# Patient Record
Sex: Male | Born: 1990 | Race: Black or African American | Hispanic: No | Marital: Single | Smoking: Never smoker
Health system: Southern US, Community
[De-identification: ages and names within clinical notes are randomized; demographics above are authoritative.]

## PROBLEM LIST (undated history)

## (undated) DIAGNOSIS — J45909 Unspecified asthma, uncomplicated: Secondary | ICD-10-CM

## (undated) DIAGNOSIS — D649 Anemia, unspecified: Secondary | ICD-10-CM

## (undated) HISTORY — DX: Anemia, unspecified: D64.9

## (undated) HISTORY — PX: COLONOSCOPY: SHX174

## (undated) HISTORY — DX: Unspecified asthma, uncomplicated: J45.909

## (undated) HISTORY — PX: ESOPHAGOGASTRODUODENOSCOPY: SHX1529

## (undated) HISTORY — PX: NO PAST SURGERIES: SHX2092

---

## 2011-05-22 ENCOUNTER — Emergency Department (HOSPITAL_COMMUNITY)
Admission: EM | Admit: 2011-05-22 | Discharge: 2011-05-22 | Disposition: A | Discharge status: Home / Self Care | Payer: PRIVATE HEALTH INSURANCE | Attending: Emergency Medicine | Admitting: Emergency Medicine

## 2011-05-22 DIAGNOSIS — R6889 Other general symptoms and signs: Secondary | ICD-10-CM | POA: Insufficient documentation

## 2011-05-22 DIAGNOSIS — Z79899 Other long term (current) drug therapy: Secondary | ICD-10-CM | POA: Insufficient documentation

## 2011-05-22 DIAGNOSIS — J45909 Unspecified asthma, uncomplicated: Secondary | ICD-10-CM | POA: Insufficient documentation

## 2011-05-22 DIAGNOSIS — R52 Pain, unspecified: Secondary | ICD-10-CM | POA: Insufficient documentation

## 2011-05-22 DIAGNOSIS — R070 Pain in throat: Secondary | ICD-10-CM | POA: Insufficient documentation

## 2011-05-22 DIAGNOSIS — J069 Acute upper respiratory infection, unspecified: Secondary | ICD-10-CM | POA: Insufficient documentation

## 2020-01-28 ENCOUNTER — Encounter: Payer: Self-pay | Admitting: Family Medicine

## 2020-02-03 ENCOUNTER — Encounter: Payer: Self-pay | Admitting: Gastroenterology

## 2020-03-21 ENCOUNTER — Ambulatory Visit (INDEPENDENT_AMBULATORY_CARE_PROVIDER_SITE_OTHER): Payer: Self-pay | Admitting: Gastroenterology

## 2020-03-21 ENCOUNTER — Encounter (INDEPENDENT_AMBULATORY_CARE_PROVIDER_SITE_OTHER): Payer: Self-pay

## 2020-03-21 VITALS — BP 120/76 | HR 64 | Ht 70.5 in | Wt 213.4 lb

## 2020-03-21 DIAGNOSIS — K625 Hemorrhage of anus and rectum: Secondary | ICD-10-CM

## 2020-03-21 DIAGNOSIS — Z01818 Encounter for other preprocedural examination: Secondary | ICD-10-CM

## 2020-03-21 NOTE — Progress Notes (Signed)
HPI: This is a very pleasant 29 year old man who was referred to me by Verlon Au, MD  to evaluate rectal bleeding.    He has had bright red blood rectally for at least a few years but it has increased in the past several months to year.  He never really has constipation or straining to move his bowels but he has started lifting weights about 1 year ago.  He has lost about 20 pounds in that interim.  He describes a bleed he has sometimes dripping into the toilet.  He never has anal pain.  Usually the blood is streaking the toilet paper.  Colon cancer does not run in his family  Old Data Reviewed: CBC April 2021 showed a normal hemoglobin but his platelets were slightly low at 125.  None   Review of systems: Pertinent positive and negative review of systems were noted in the above HPI section. All other review negative.   Past Medical History:  Diagnosis Date  . Asthma     Past Surgical History:  Procedure Laterality Date  . NO PAST SURGERIES      No current outpatient medications on file.   No current facility-administered medications for this visit.    Allergies as of 03/21/2020  . (No Known Allergies)    Family History  Problem Relation Age of Onset  . Hypertension Mother   . Migraines Mother   . Asthma Brother   . Asthma Paternal Grandmother     Social History   Socioeconomic History  . Marital status: Single    Spouse name: Not on file  . Number of children: 0  . Years of education: Not on file  . Highest education level: Not on file  Occupational History  . Not on file  Tobacco Use  . Smoking status: Never Smoker  . Smokeless tobacco: Never Used  Vaping Use  . Vaping Use: Never used  Substance and Sexual Activity  . Alcohol use: Not Currently  . Drug use: Never  . Sexual activity: Not on file  Other Topics Concern  . Not on file  Social History Narrative  . Not on file   Social Determinants of Health   Financial Resource Strain:    . Difficulty of Paying Living Expenses:   Food Insecurity:   . Worried About Programme researcher, broadcasting/film/video in the Last Year:   . Barista in the Last Year:   Transportation Needs:   . Freight forwarder (Medical):   Marland Kitchen Lack of Transportation (Non-Medical):   Physical Activity:   . Days of Exercise per Week:   . Minutes of Exercise per Session:   Stress:   . Feeling of Stress :   Social Connections:   . Frequency of Communication with Friends and Family:   . Frequency of Social Gatherings with Friends and Family:   . Attends Religious Services:   . Active Member of Clubs or Organizations:   . Attends Banker Meetings:   Marland Kitchen Marital Status:   Intimate Partner Violence:   . Fear of Current or Ex-Partner:   . Emotionally Abused:   Marland Kitchen Physically Abused:   . Sexually Abused:      Physical Exam: Ht 5' 10.5" (1.791 m) Comment: height measured without shoes  Wt 213 lb 6 oz (96.8 kg)   BMI 30.18 kg/m  Constitutional: generally well-appearing Psychiatric: alert and oriented x3 Eyes: extraocular movements intact Mouth: oral pharynx moist, no lesions Neck: supple no lymphadenopathy  Cardiovascular: heart regular rate and rhythm Lungs: clear to auscultation bilaterally Abdomen: soft, nontender, nondistended, no obvious ascites, no peritoneal signs, normal bowel sounds Extremities: no lower extremity edema bilaterally Skin: no lesions on visible extremities Rectal examination: Small amount of deflated external hemorrhoid tissue.  No anal fissures, digital rectal exam was otherwise normal.  Brown stool that was not checked for Hemoccult  Assessment and plan: 29 y.o. male with minor rectal bleeding  I suspect his minor rectal bleeding is related to hemorrhoids.  I did not mention above but he did try some prescription hemorrhoid cream for about 1 week if not make a difference.  He clearly has external hemorrhoids.  He may also have internal hemorrhoids.  I recommended a  flexible sigmoidoscopy at his soonest convenience to characterize the hemorrhoids and also to exclude other potential causes of his rectal bleeding such as neoplasm which I think is unlikely.  The possibly if this is indeed just hemorrhoid disease that his recent weight lifting may be playing a role.     Please see the "Patient Instructions" section for addition details about the plan.   Rob Bunting, MD  Gastroenterology 03/21/2020, 3:09 PM  Cc: Verlon Au, MD  Total time on date of encounter was 45  minutes (this included time spent preparing to see the patient reviewing records; obtaining and/or reviewing separately obtained history; performing a medically appropriate exam and/or evaluation; counseling and educating the patient and family if present; ordering medications, tests or procedures if applicable; and documenting clinical information in the health record).

## 2020-03-21 NOTE — Patient Instructions (Signed)
If you are age 29 or older, your body mass index should be between 23-30. Your Body mass index is 30.18 kg/m. If this is out of the aforementioned range listed, please consider follow up with your Primary Care Provider.  If you are age 3 or younger, your body mass index should be between 19-25. Your Body mass index is 30.18 kg/m. If this is out of the aformentioned range listed, please consider follow up with your Primary Care Provider.   You have been scheduled for a flexible sigmoidoscopy. Please follow the written instructions given to you at your visit today. If you use inhalers (even only as needed), please bring them with you on the day of your procedure.  Due to recent changes in healthcare laws, you may see the results of your imaging and laboratory studies on MyChart before your provider has had a chance to review them.  We understand that in some cases there may be results that are confusing or concerning to you. Not all laboratory results come back in the same time frame and the provider may be waiting for multiple results in order to interpret others.  Please give [place] 48 hours in order for your provider to thoroughly review all the results before contacting the office for clarification of your results.   Thank you for entrusting me with your care and choosing [facility].  Dr Christella Hartigan

## 2020-05-25 ENCOUNTER — Other Ambulatory Visit: Payer: Self-pay | Admitting: Gastroenterology

## 2021-01-03 ENCOUNTER — Telehealth: Payer: Self-pay | Admitting: Hematology and Oncology

## 2021-01-03 NOTE — Telephone Encounter (Signed)
Received a new hem referral from Virl Son from [facility] for pancytopenia. Ms. Vowels has been cld and scheduled to see Dr. Bertis Ruddy on 5/13 at 1pm. Pt aware to arrive 30 minutes early. A good faith estimate letter has been mailed to the pt.

## 2021-01-04 ENCOUNTER — Encounter: Payer: Self-pay | Admitting: Hematology and Oncology

## 2021-01-10 ENCOUNTER — Encounter: Payer: Self-pay | Admitting: Hematology and Oncology

## 2021-01-10 DIAGNOSIS — N183 Chronic kidney disease, stage 3 unspecified: Secondary | ICD-10-CM | POA: Insufficient documentation

## 2021-01-10 DIAGNOSIS — N182 Chronic kidney disease, stage 2 (mild): Secondary | ICD-10-CM | POA: Insufficient documentation

## 2021-01-10 DIAGNOSIS — D61818 Other pancytopenia: Secondary | ICD-10-CM | POA: Insufficient documentation

## 2021-01-11 ENCOUNTER — Ambulatory Visit: Payer: Self-pay

## 2021-01-11 ENCOUNTER — Other Ambulatory Visit: Payer: Self-pay

## 2021-01-11 ENCOUNTER — Other Ambulatory Visit: Payer: Self-pay | Admitting: Family Medicine

## 2021-01-11 DIAGNOSIS — Z0000 Encounter for general adult medical examination without abnormal findings: Secondary | ICD-10-CM

## 2021-01-12 ENCOUNTER — Inpatient Hospital Stay: Payer: Self-pay | Attending: Hematology and Oncology

## 2021-01-12 ENCOUNTER — Encounter: Payer: Self-pay | Admitting: Hematology and Oncology

## 2021-01-12 ENCOUNTER — Inpatient Hospital Stay (HOSPITAL_BASED_OUTPATIENT_CLINIC_OR_DEPARTMENT_OTHER): Payer: Self-pay | Admitting: Hematology and Oncology

## 2021-01-12 VITALS — BP 136/72 | HR 59 | Temp 97.9°F | Resp 18 | Ht 70.5 in | Wt 218.0 lb

## 2021-01-12 DIAGNOSIS — E538 Deficiency of other specified B group vitamins: Secondary | ICD-10-CM | POA: Insufficient documentation

## 2021-01-12 DIAGNOSIS — D61818 Other pancytopenia: Secondary | ICD-10-CM | POA: Insufficient documentation

## 2021-01-12 DIAGNOSIS — R5383 Other fatigue: Secondary | ICD-10-CM

## 2021-01-12 DIAGNOSIS — N182 Chronic kidney disease, stage 2 (mild): Secondary | ICD-10-CM

## 2021-01-12 DIAGNOSIS — K625 Hemorrhage of anus and rectum: Secondary | ICD-10-CM | POA: Insufficient documentation

## 2021-01-12 DIAGNOSIS — N189 Chronic kidney disease, unspecified: Secondary | ICD-10-CM | POA: Insufficient documentation

## 2021-01-12 DIAGNOSIS — D631 Anemia in chronic kidney disease: Secondary | ICD-10-CM

## 2021-01-12 LAB — CBC WITH DIFFERENTIAL/PLATELET
Abs Immature Granulocytes: 0.01 10*3/uL (ref 0.00–0.07)
Basophils Absolute: 0 10*3/uL (ref 0.0–0.1)
Basophils Relative: 1 %
Eosinophils Absolute: 0.2 10*3/uL (ref 0.0–0.5)
Eosinophils Relative: 6 %
HCT: 40 % (ref 39.0–52.0)
Hemoglobin: 13.5 g/dL (ref 13.0–17.0)
Immature Granulocytes: 0 %
Lymphocytes Relative: 34 %
Lymphs Abs: 1.2 10*3/uL (ref 0.7–4.0)
MCH: 29.5 pg (ref 26.0–34.0)
MCHC: 33.8 g/dL (ref 30.0–36.0)
MCV: 87.5 fL (ref 80.0–100.0)
Monocytes Absolute: 0.4 10*3/uL (ref 0.1–1.0)
Monocytes Relative: 11 %
Neutro Abs: 1.6 10*3/uL — ABNORMAL LOW (ref 1.7–7.7)
Neutrophils Relative %: 48 %
Platelets: 146 10*3/uL — ABNORMAL LOW (ref 150–400)
RBC: 4.57 MIL/uL (ref 4.22–5.81)
RDW: 12.5 % (ref 11.5–15.5)
WBC: 3.4 10*3/uL — ABNORMAL LOW (ref 4.0–10.5)
nRBC: 0 % (ref 0.0–0.2)

## 2021-01-12 LAB — VITAMIN B12: Vitamin B-12: 186 pg/mL (ref 180–914)

## 2021-01-12 LAB — SEDIMENTATION RATE: Sed Rate: 10 mm/hr (ref 0–16)

## 2021-01-12 LAB — IRON AND TIBC
Iron: 72 ug/dL (ref 42–163)
Saturation Ratios: 21 % (ref 20–55)
TIBC: 340 ug/dL (ref 202–409)
UIBC: 268 ug/dL (ref 117–376)

## 2021-01-12 LAB — FERRITIN: Ferritin: 28 ng/mL (ref 24–336)

## 2021-01-12 LAB — TSH: TSH: 1.983 u[IU]/mL (ref 0.320–4.118)

## 2021-01-12 NOTE — Progress Notes (Signed)
[facility] CONSULT NOTE  Patient Care Team: Verlon Au, MD as PCP - General (Family Medicine)  CHIEF COMPLAINTS/PURPOSE OF CONSULTATION:  Chronic pancytopenia  HISTORY OF PRESENTING ILLNESS:  Charles Gomez 30 y.o. male is here because of chronic pancytopenia  He was found to have abnormal CBC from blood count done by his primary care doctor I have the opportunity to review his CBC dated back to 2016 He had normal white blood cell count and platelet count in 2018 and 2016 but has been anemic since 2016 with hemoglobin around 13.6 Starting last year from 03/23/20, he was noted to have intermittent leukopenia with white count as low as 3.0, chronic anemia with hemoglobin around 13 and platelet count of 111 He denies recent chest pain on exertion, shortness of breath on minimal exertion, pre-syncopal episodes, or palpitations. He has been having chronic rectal bleeding with passage of stool for over 5 years He denies spontaneous nosebleed or hematuria He has seen gastroenterologist last year and was recommended sigmoidoscopy but declined due to lack of insurance  The patient denies over the counter NSAID ingestion. He is not on antiplatelets agents. He has been on a vegan diet for the last 3 years He used to take a lot of nutritional supplements including zinc but stopped last year  He had no prior history or diagnosis of cancer. His age appropriate screening programs are up-to-date. He denies any pica  He never donated blood or received blood transfusion He denies recurrent infection He was evaluated with renal ultrasound by nephrologist with no cause found for his chronic renal failure  MEDICAL HISTORY:  Past Medical History:  Diagnosis Date  . Anemia   . Asthma     SURGICAL HISTORY: Past Surgical History:  Procedure Laterality Date  . NO PAST SURGERIES      SOCIAL HISTORY: Social History   Socioeconomic History  . Marital status: Single    Spouse  name: Not on file  . Number of children: 0  . Years of education: Not on file  . Highest education level: Not on file  Occupational History  . Occupation: work in a lab and post office  Tobacco Use  . Smoking status: Never Smoker  . Smokeless tobacco: Never Used  Vaping Use  . Vaping Use: Never used  Substance and Sexual Activity  . Alcohol use: Not Currently  . Drug use: Never  . Sexual activity: Not on file  Other Topics Concern  . Not on file  Social History Narrative  . Not on file   Social Determinants of Health   Financial Resource Strain: Not on file  Food Insecurity: Not on file  Transportation Needs: Not on file  Physical Activity: Not on file  Stress: Not on file  Social Connections: Not on file  Intimate Partner Violence: Not on file    FAMILY HISTORY: Family History  Problem Relation Age of Onset  . Hypertension Mother   . Migraines Mother   . Asthma Brother   . Asthma Paternal Grandmother     ALLERGIES:  has No Known Allergies.  MEDICATIONS:  Current Outpatient Medications  Medication Sig Dispense Refill  . cholecalciferol (VITAMIN D3) 25 MCG (1000 UNIT) tablet Take 2,000 Units by mouth daily.     No current facility-administered medications for this visit.    REVIEW OF SYSTEMS:   Constitutional: Denies fevers, chills or abnormal night sweats Eyes: Denies blurriness of vision, double vision or watery eyes Ears, nose, mouth, throat, and  face: Denies mucositis or sore throat Respiratory: Denies cough, dyspnea or wheezes Cardiovascular: Denies palpitation, chest discomfort or lower extremity swelling Gastrointestinal:  Denies nausea, heartburn or change in bowel habits Skin: Denies abnormal skin rashes Lymphatics: Denies new lymphadenopathy or easy bruising Neurological:Denies numbness, tingling or new weaknesses Behavioral/Psych: Mood is stable, no new changes  All other systems were reviewed with the patient and are negative.  PHYSICAL  EXAMINATION: ECOG PERFORMANCE STATUS: 1 - Symptomatic but completely ambulatory  Vitals:   01/12/21 1317  BP: 136/72  Pulse: (!) 59  Resp: 18  Temp: 97.9 F (36.6 C)  SpO2: 100%   Filed Weights   01/12/21 1317  Weight: 218 lb (98.9 kg)    GENERAL:alert, no distress and comfortable SKIN: skin color, texture, turgor are normal, no rashes or significant lesions EYES: normal, conjunctiva are pink and non-injected, sclera clear OROPHARYNX:no exudate, no erythema and lips, buccal mucosa, and tongue normal  NECK: supple, thyroid normal size, non-tender, without nodularity LYMPH:  no palpable lymphadenopathy in the cervical, axillary or inguinal LUNGS: clear to auscultation and percussion with normal breathing effort HEART: regular rate & rhythm and no murmurs and no lower extremity edema ABDOMEN:abdomen soft, non-tender and normal bowel sounds Musculoskeletal:no cyanosis of digits and no clubbing  PSYCH: alert & oriented x 3 with fluent speech NEURO: no focal motor/sensory deficits  RADIOGRAPHIC STUDIES: I have personally reviewed the radiological images as listed and agreed with the findings in the report. DG Chest 1 View  Result Date: 01/11/2021 CLINICAL DATA:  Physical exam. EXAM: CHEST  1 VIEW COMPARISON:  None. FINDINGS: The heart size and mediastinal contours are within normal limits. Both lungs are clear. The visualized skeletal structures are unremarkable. IMPRESSION: No active disease. Electronically Signed   By: Obie Dredge M.D.   On: 01/11/2021 14:38    ASSESSMENT & PLAN:  Anemia in chronic kidney disease The cause of the anemia is multifactorial, likely due to anemia of chronic kidney disease compounded by intermittent rectal bleeding He is also on a vegan diet and could have some mineral deficiencies I will order additional work-up  CKD (chronic kidney disease), stage II He is noted to have elevated serum creatinine since 2016, cause unknown We discussed the  importance of adequate hydration  Rectal bleeding He has chronic rectal bleeding, likely due to hemorrhoids based on prior gastroenterologist evaluation He has not pursue sigmoidoscopy as recommended by GI physician due to lack of insurance   Other pancytopenia (HCC) He has intermittent leukopenia, causes unknown I am wondering whether he could have vitamin B12 deficiency due to his vegan diet He is not symptomatic From the thrombocytopenia standpoint, his MPV is high I am wondering whether he could have very mild ITP I will order additional work-up and we will see him back next week for further follow-up  Orders Placed This Encounter  Procedures  . CBC with Differential/Platelet    Standing Status:   Future    Number of Occurrences:   1    Standing Expiration Date:   01/12/2022  . Ferritin    Standing Status:   Future    Number of Occurrences:   1    Standing Expiration Date:   01/12/2022  . Iron and TIBC    Standing Status:   Future    Number of Occurrences:   1    Standing Expiration Date:   01/12/2022  . Vitamin B12    Standing Status:   Future    Number of  Occurrences:   1    Standing Expiration Date:   01/12/2022  . Sedimentation rate    Standing Status:   Future    Number of Occurrences:   1    Standing Expiration Date:   01/12/2022  . ANA, IFA (with reflex)    Standing Status:   Future    Number of Occurrences:   1    Standing Expiration Date:   01/12/2022  . TSH    Standing Status:   Future    Number of Occurrences:   1    Standing Expiration Date:   01/12/2022    All questions were answered. The patient knows to call the clinic with any problems, questions or concerns.  The total time spent in the appointment was 45 minutes encounter with patients including review of chart and various tests results, discussions about plan of care and coordination of care plan  Artis Delay, MD 5/13/20221:50 PM       Artis Delay, MD 01/12/21 1:50 PM

## 2021-01-12 NOTE — Assessment & Plan Note (Signed)
He has intermittent leukopenia, causes unknown I am wondering whether he could have vitamin B12 deficiency due to his vegan diet He is not symptomatic From the thrombocytopenia standpoint, his MPV is high I am wondering whether he could have very mild ITP I will order additional work-up and we will see him back next week for further follow-up

## 2021-01-12 NOTE — Assessment & Plan Note (Signed)
He is noted to have elevated serum creatinine since 2016, cause unknown We discussed the importance of adequate hydration

## 2021-01-12 NOTE — Assessment & Plan Note (Signed)
The cause of the anemia is multifactorial, likely due to anemia of chronic kidney disease compounded by intermittent rectal bleeding He is also on a vegan diet and could have some mineral deficiencies I will order additional work-up

## 2021-01-12 NOTE — Assessment & Plan Note (Signed)
He has chronic rectal bleeding, likely due to hemorrhoids based on prior gastroenterologist evaluation He has not pursue sigmoidoscopy as recommended by GI physician due to lack of insurance

## 2021-01-14 LAB — ANTINUCLEAR ANTIBODIES, IFA: ANA Ab, IFA: NEGATIVE

## 2021-01-16 ENCOUNTER — Encounter: Payer: Self-pay | Admitting: Hematology and Oncology

## 2021-01-16 ENCOUNTER — Telehealth: Payer: Self-pay | Admitting: Hematology and Oncology

## 2021-01-16 ENCOUNTER — Other Ambulatory Visit: Payer: Self-pay

## 2021-01-16 ENCOUNTER — Inpatient Hospital Stay: Payer: Self-pay | Admitting: Hematology and Oncology

## 2021-01-16 VITALS — BP 134/94 | HR 61 | Temp 97.4°F | Resp 18 | Ht 70.5 in | Wt 217.2 lb

## 2021-01-16 DIAGNOSIS — E611 Iron deficiency: Secondary | ICD-10-CM

## 2021-01-16 DIAGNOSIS — E538 Deficiency of other specified B group vitamins: Secondary | ICD-10-CM

## 2021-01-16 DIAGNOSIS — D61818 Other pancytopenia: Secondary | ICD-10-CM

## 2021-01-16 NOTE — Assessment & Plan Note (Signed)
I have reviewed multiple test results with the patient Overall, his pancytopenia is most consistent with borderline vitamin B12 deficiency, likely due to his vegan diet I recommend high-dose oral vitamin B12 supplement and I plan to check it again in 6 months

## 2021-01-16 NOTE — Assessment & Plan Note (Signed)
His iron deficiency is likely due to his dietary choices and chronic rectal bleeding I recommend oral iron supplement

## 2021-01-16 NOTE — Telephone Encounter (Signed)
Scheduled appointment per 05/17 schedule message. Patient is aware. 

## 2021-01-16 NOTE — Progress Notes (Signed)
[facility] OFFICE PROGRESS NOTE  Charles Au, MD  ASSESSMENT & PLAN:  Other pancytopenia [facility]) I have reviewed multiple test results with the patient Overall, his pancytopenia is most consistent with borderline vitamin B12 deficiency, likely due to his vegan diet I recommend high-dose oral vitamin B12 supplement and I plan to check it again in 6 months  Iron deficiency His iron deficiency is likely due to his dietary choices and chronic rectal bleeding I recommend oral iron supplement   Orders Placed This Encounter  Procedures  . Iron and TIBC    Standing Status:   Future    Standing Expiration Date:   01/16/2022  . Ferritin    Standing Status:   Future    Standing Expiration Date:   01/16/2022  . Vitamin B12    Standing Status:   Future    Standing Expiration Date:   01/16/2022  . CBC with Differential/Platelet    Standing Status:   Future    Standing Expiration Date:   01/16/2022    The total time spent in the appointment was 20 minutes encounter with patients including review of chart and various tests results, discussions about plan of care and coordination of care plan   All questions were answered. The patient knows to call the clinic with any problems, questions or concerns. No barriers to learning was detected.    Artis Delay, MD 5/17/20229:58 AM  INTERVAL HISTORY: Charles Gomez 30 y.o. male returns for further follow-up He was seen last week for signs of chronic pancytopenia He was not symptomatic The patient consumes a vegan diet for the last few years He has chronic renal failure of unknown reason and chronic rectal bleeding due to hemorrhoids  SUMMARY OF HEMATOLOGIC HISTORY: Charles Gomez 30 y.o. male is here because of chronic pancytopenia  He was found to have abnormal CBC from blood count done by his primary care doctor I have the opportunity to review his CBC dated back to 2016 He had normal white blood cell count and platelet count  in 2018 and 2016 but has been anemic since 2016 with hemoglobin around 13.6 Starting last year from 03/23/20, he was noted to have intermittent leukopenia with white count as low as 3.0, chronic anemia with hemoglobin around 13 and platelet count of 111 He denies recent chest pain on exertion, shortness of breath on minimal exertion, pre-syncopal episodes, or palpitations. He has been having chronic rectal bleeding with passage of stool for over 5 years He denies spontaneous nosebleed or hematuria He has seen gastroenterologist last year and was recommended sigmoidoscopy but declined due to lack of insurance  The patient denies over the counter NSAID ingestion. He is not on antiplatelets agents. He has been on a vegan diet for the last 3 years He used to take a lot of nutritional supplements including zinc but stopped last year  He had no prior history or diagnosis of cancer. His age appropriate screening programs are up-to-date. He denies any pica  He never donated blood or received blood transfusion He denies recurrent infection He was evaluated with renal ultrasound by nephrologist with no cause found for his chronic renal failure Blood work from May 2020 to confirm borderline iron and B12 deficiency  I have reviewed the past medical history, past surgical history, social history and family history with the patient and they are unchanged from previous note.  ALLERGIES:  has No Known Allergies.  MEDICATIONS:  Current Outpatient Medications  Medication Sig Dispense  Refill  . ferrous sulfate 324 MG TBEC Take 324 mg by mouth.    . vitamin B-12 (CYANOCOBALAMIN) 1000 MCG tablet Take 1,000 mcg by mouth daily.    . cholecalciferol (VITAMIN D3) 25 MCG (1000 UNIT) tablet Take 2,000 Units by mouth daily.     No current facility-administered medications for this visit.     REVIEW OF SYSTEMS:   Constitutional: Denies fevers, chills or night sweats Eyes: Denies blurriness of vision Ears, nose,  mouth, throat, and face: Denies mucositis or sore throat Respiratory: Denies cough, dyspnea or wheezes Cardiovascular: Denies palpitation, chest discomfort or lower extremity swelling Gastrointestinal:  Denies nausea, heartburn or change in bowel habits Skin: Denies abnormal skin rashes Lymphatics: Denies new lymphadenopathy or easy bruising Neurological:Denies numbness, tingling or new weaknesses Behavioral/Psych: Mood is stable, no new changes  All other systems were reviewed with the patient and are negative.  PHYSICAL EXAMINATION: ECOG PERFORMANCE STATUS: 0 - Asymptomatic  Vitals:   01/16/21 0841  BP: (!) 134/94  Pulse: 61  Resp: 18  Temp: (!) 97.4 F (36.3 C)  SpO2: 100%   Filed Weights   01/16/21 0841  Weight: 217 lb 3.2 oz (98.5 kg)    GENERAL:alert, no distress and comfortable NEURO: alert & oriented x 3 with fluent speech, no focal motor/sensory deficits  LABORATORY DATA:  I have reviewed the data as listed  No results found for: NA, K, CL, CO2, GLUCOSE, BUN, CREATININE, CALCIUM, PROT, ALBUMIN, AST, ALT, ALKPHOS, BILITOT, GFRNONAA, GFRAA  No results found for: SPEP, UPEP  Lab Results  Component Value Date   WBC 3.4 (L) 01/12/2021   NEUTROABS 1.6 (L) 01/12/2021   HGB 13.5 01/12/2021   HCT 40.0 01/12/2021   MCV 87.5 01/12/2021   PLT 146 (L) 01/12/2021      Chemistry   No results found for: NA, K, CL, CO2, BUN, CREATININE, GLU No results found for: CALCIUM, ALKPHOS, AST, ALT, BILITOT

## 2021-07-13 ENCOUNTER — Other Ambulatory Visit: Payer: Self-pay

## 2021-07-16 ENCOUNTER — Ambulatory Visit: Payer: Self-pay | Admitting: Hematology and Oncology

## 2021-07-17 ENCOUNTER — Other Ambulatory Visit: Payer: Self-pay

## 2021-07-19 ENCOUNTER — Ambulatory Visit: Payer: Self-pay | Admitting: Hematology and Oncology

## 2021-07-19 ENCOUNTER — Other Ambulatory Visit: Payer: Self-pay

## 2022-06-13 ENCOUNTER — Encounter (HOSPITAL_COMMUNITY): Payer: Self-pay

## 2022-06-13 ENCOUNTER — Ambulatory Visit (HOSPITAL_COMMUNITY): Payer: Managed Care, Other (non HMO)

## 2022-06-13 ENCOUNTER — Ambulatory Visit (HOSPITAL_COMMUNITY)
Admission: RE | Admit: 2022-06-13 | Discharge: 2022-06-13 | Disposition: A | Discharge status: Home / Self Care | Payer: Managed Care, Other (non HMO) | Source: Ambulatory Visit | Attending: Physician Assistant | Admitting: Physician Assistant

## 2022-06-13 VITALS — BP 135/78 | Temp 98.4°F | Resp 17

## 2022-06-13 DIAGNOSIS — M62838 Other muscle spasm: Secondary | ICD-10-CM

## 2022-06-13 DIAGNOSIS — S161XXA Strain of muscle, fascia and tendon at neck level, initial encounter: Secondary | ICD-10-CM | POA: Diagnosis not present

## 2022-06-13 DIAGNOSIS — M542 Cervicalgia: Secondary | ICD-10-CM

## 2022-06-13 MED ORDER — METHOCARBAMOL 500 MG PO TABS: 500.0000 mg | ORAL_TABLET | Freq: Two times a day (BID) | ORAL | 0 refills | Status: DC
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
## 2022-06-13 NOTE — Discharge Instructions (Signed)
Your x-ray was normal with no evidence of fracture.  I believe that you strained the muscles in your neck.  Use heat, rest, stretch for symptom relief.  Take Robaxin up to twice a day.  This will make you sleepy do not drive or drink alcohol with taking it.  You can alternate Tylenol ibuprofen over-the-counter.  I do recommend you follow-up with sports medicine if your symptoms or not improving quickly.  They can help establish you with physical therapy and other interventions that may help improve your symptoms.  If anything worsens you develop significant pain, weakness, numbness or paresthesias in your hands you should be seen immediately.
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
## 2022-07-03 ENCOUNTER — Ambulatory Visit: Payer: Self-pay

## 2022-07-04 IMAGING — DX DG CHEST 1V
1 series · 1 of 1 positions shown · non-contrast
Comparison: None.

CLINICAL DATA: Physical exam.

EXAM:
CHEST  1 VIEW

[chest pa]
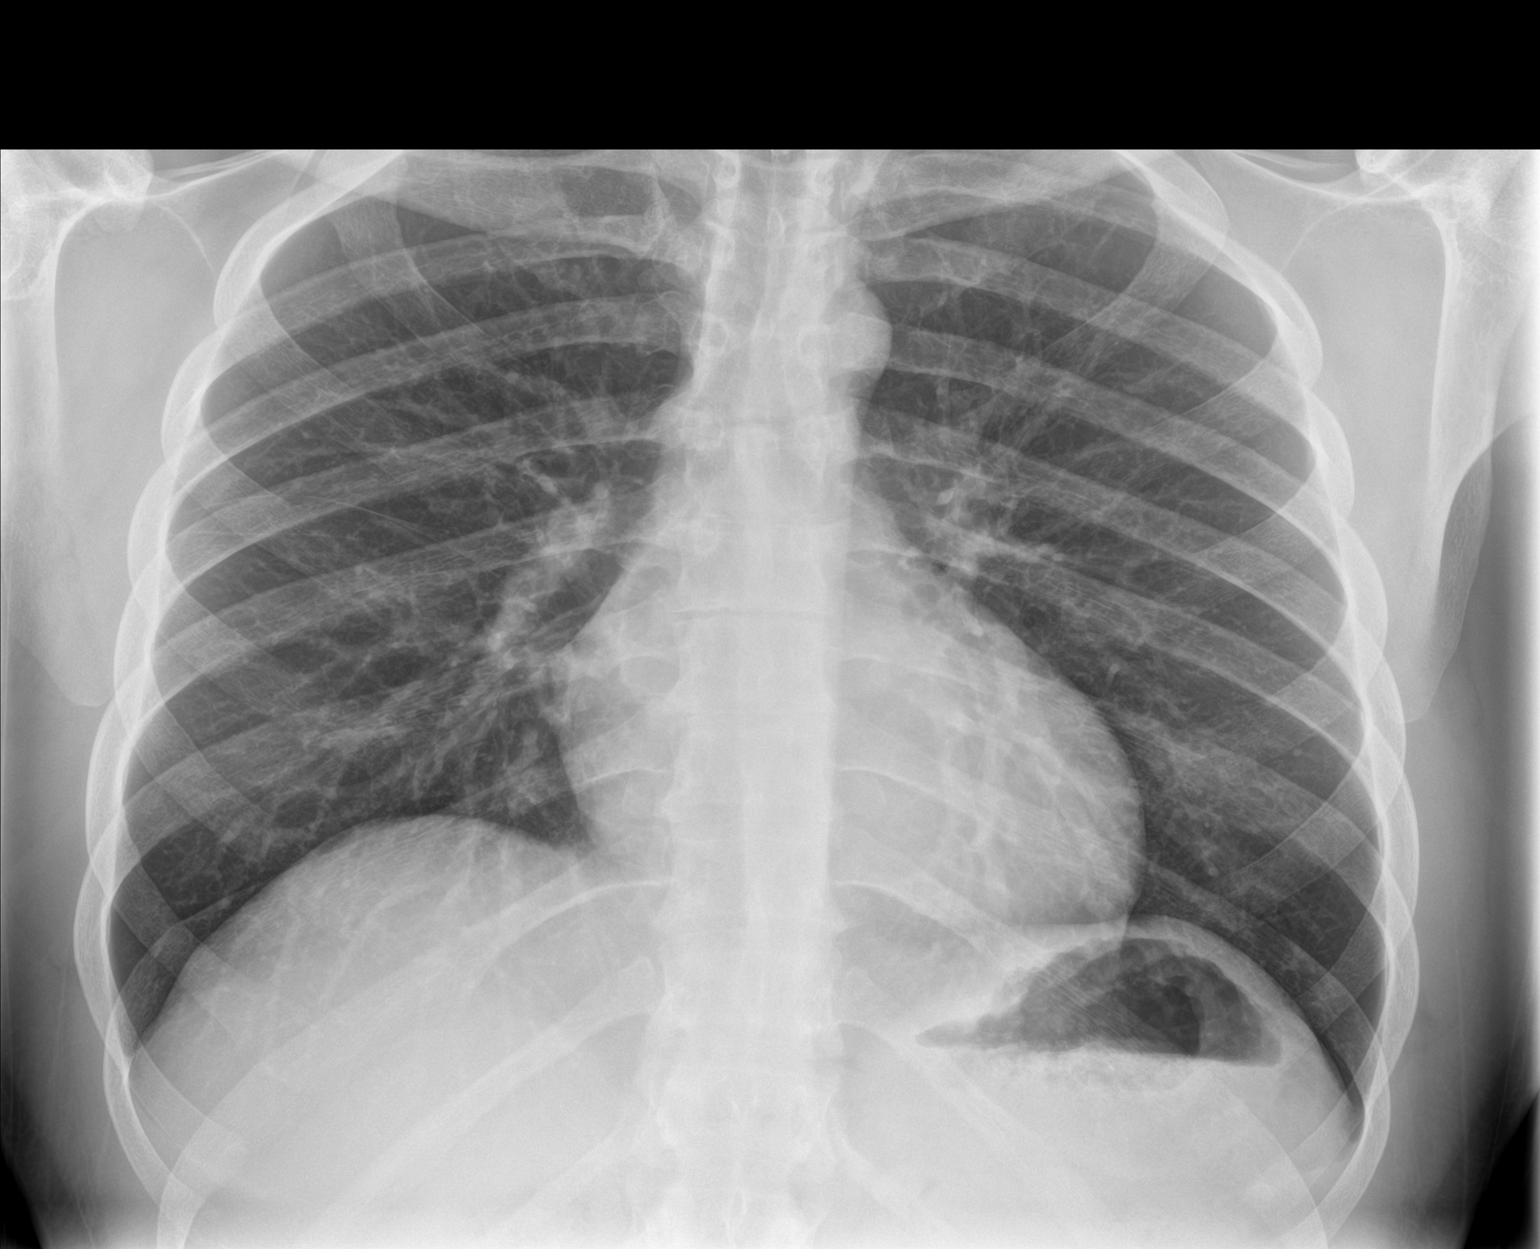

[1 of 1 positions shown; findings below may reference images not displayed]

FINDINGS: The heart size and mediastinal contours are within normal limits.
Both lungs are clear. The visualized skeletal structures are
unremarkable.
IMPRESSION: No active disease.

## 2023-03-21 ENCOUNTER — Encounter (HOSPITAL_COMMUNITY): Payer: Self-pay

## 2023-03-21 ENCOUNTER — Ambulatory Visit (HOSPITAL_COMMUNITY)
Admission: RE | Admit: 2023-03-21 | Discharge: 2023-03-21 | Disposition: A | Discharge status: Home / Self Care | Payer: Managed Care, Other (non HMO) | Source: Ambulatory Visit | Attending: Nurse Practitioner

## 2023-03-21 VITALS — BP 124/80 | HR 82 | Temp 98.0°F | Resp 16

## 2023-03-21 DIAGNOSIS — J069 Acute upper respiratory infection, unspecified: Secondary | ICD-10-CM | POA: Diagnosis not present

## 2023-03-21 MED ORDER — BENZONATATE 100 MG PO CAPS: 100.0000 mg | ORAL_CAPSULE | Freq: Three times a day (TID) | ORAL | 0 refills | Status: DC | PRN

## 2023-03-21 NOTE — Discharge Instructions (Addendum)
You have a viral upper respiratory infection.  Symptoms should improve over the next week to 10 days.  If you develop chest pain or shortness of breath, go to the emergency room.  Some things that can make you feel better are: - Increased rest - Increasing fluid with water/sugar free electrolytes - Acetaminophen and ibuprofen as needed for fever/pain - Salt water gargling, chloraseptic spray and throat lozenges - OTC guaifenesin (Mucinex) 600 mg twice daily for congestion - Saline sinus flushes or a neti pot - Humidifying the air -Tessalon Perles every 8 hours as needed for dry cough 

## 2023-03-21 NOTE — ED Triage Notes (Signed)
Patient reports that he has a productive cough with green sputum x 2 days.  Patient states he has taken allergy medication and using cough drops.

## 2023-03-21 NOTE — ED Provider Notes (Signed)
[facility]    CSN: [id number] Arrival date & time: 03/21/23  1203      History   Chief Complaint Chief Complaint  Patient presents with   Cough    Entered by patient    HPI Charles Gomez is a 32 y.o. male.   Patient presents today with 2 day history of congestion cough, stuffy nose, runny nose, sore throat when he coughs, decreased appetite, and fatigue.  Patient denies fever, body aches or chills, shortness of breath or chest pain, chest tightness, chest congestion, headache, ear pain, abdominal pain, nausea/vomiting, and diarrhea.  No loss of taste or smell or known sick contacts. Has taken allergy medicine without improvement in symptoms.  Reports history of asthma.  At home COVID-19 test was negative.    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 Authorizing Provider  benzonatate (TESSALON) 100 MG capsule Take 1 capsule (100 mg total) by mouth 3 (three) times daily as needed for cough. Do not take with alcohol or while driving or operating heavy machinery.  May cause drowsiness. 03/21/23  Yes Valentino Nose, NP  cholecalciferol (VITAMIN D3) 25 MCG (1000 UNIT) tablet Take 2,000 Units by mouth daily.    [provider]  ferrous sulfate 324 MG TBEC Take 324 mg by mouth.    [provider]  methocarbamol (ROBAXIN) 500 MG tablet Take 1 tablet (500 mg total) by mouth 2 (two) times daily. 06/13/22   Raspet, Noberto Retort, PA-C  vitamin B-12  (CYANOCOBALAMIN) 1000 MCG tablet Take 1,000 mcg by mouth daily.    [provider]    Family History Family History  Problem Relation Age of Onset   Hypertension Mother    Migraines Mother    Healthy Father    Asthma Brother    Asthma Paternal Grandmother     Social History Social History   Tobacco Use   Smoking status: Never   Smokeless tobacco: Never  Vaping Use   Vaping status: Never Used  Substance Use Topics   Alcohol use: Not Currently   Drug use: Never     Allergies   Patient has no known allergies.   Review of Systems Review of Systems Per HPI  Physical Exam Triage Vital Signs ED Triage Vitals  Encounter Vitals Group     BP 03/21/23 1213 124/80     Systolic BP Percentile --      Diastolic BP Percentile --      Pulse Rate 03/21/23 1213 82     Resp 03/21/23 1213 16     Temp 03/21/23 1213 98 F (36.7 C)     Temp Source 03/21/23 1213 Oral     SpO2 03/21/23 1213 97 %     Weight --      Height --      Head Circumference --      Peak Flow --  Pain Score 03/21/23 1214 0     Pain Loc --      Pain Education --      Exclude from Growth Chart --    No data found.  Updated Vital Signs BP 124/80 (BP Location: Right Arm)   Pulse 82   Temp 98 F (36.7 C) (Oral)   Resp 16   SpO2 97%   Visual Acuity Right Eye Distance:   Left Eye Distance:   Bilateral Distance:    Right Eye Near:   Left Eye Near:    Bilateral Near:     Physical Exam Vitals and nursing note reviewed.  Constitutional:      General: He is not in acute distress.    Appearance: Normal appearance. He is not ill-appearing or toxic-appearing.  HENT:     Head: Normocephalic and atraumatic.     Right Ear: Tympanic membrane, ear canal and external ear normal.     Left Ear: Tympanic membrane, ear canal and external ear normal.     Nose: Congestion present. No rhinorrhea.     Mouth/Throat:     Mouth: Mucous membranes are moist.     Pharynx: Oropharynx is clear. Posterior  oropharyngeal erythema present. No oropharyngeal exudate.  Eyes:     General: No scleral icterus.    Extraocular Movements: Extraocular movements intact.  Cardiovascular:     Rate and Rhythm: Normal rate and regular rhythm.  Pulmonary:     Effort: Pulmonary effort is normal. No respiratory distress.     Breath sounds: Normal breath sounds. No wheezing, rhonchi or rales.  Musculoskeletal:     Cervical back: Normal range of motion and neck supple. No rigidity.  Lymphadenopathy:     Cervical: No cervical adenopathy.  Skin:    General: Skin is warm and dry.     Coloration: Skin is not jaundiced or pale.     Findings: No erythema or rash.  Neurological:     Mental Status: He is alert and oriented to person, place, and time.  Psychiatric:        Behavior: Behavior is cooperative.      UC Treatments / Results  Labs (all labs ordered are listed, but only abnormal results are displayed) Labs Reviewed - No data to display  EKG   Radiology No results found.  Procedures Procedures (including critical care time)  Medications Ordered in UC Medications - No data to display  Initial Impression / Assessment and Plan / UC Course  I have reviewed the triage vital signs and the nursing notes.  Pertinent labs & imaging results that were available during my care of the patient were reviewed by me and considered in my medical decision making (see chart for details).   Patient is well-appearing, normotensive, afebrile, not tachycardic, not tachypneic, oxygenating well on room air.    1.  Viral URI with cough  Suspect viral etiology Vitals and examination today are reassuring COVID-19 testing declined by patient Supportive care discussed Start Tessalon perles ER and return precautions discussed Work excuse given  The patient was given the opportunity to ask questions.  All questions answered to their satisfaction.  The patient is in agreement to this plan.    Final Clinical  Impressions(s) / UC Diagnoses   Final diagnoses:  Viral URI with cough     Discharge Instructions      You have a viral upper respiratory infection.  Symptoms should improve over the next week to 10 days.  If you develop  chest pain or shortness of breath, go to the emergency room.  Some things that can make you feel better are: - Increased rest - Increasing fluid with water/sugar free electrolytes - Acetaminophen and ibuprofen as needed for fever/pain - Salt water gargling, chloraseptic spray and throat lozenges - OTC guaifenesin (Mucinex) 600 mg twice daily for congestion - Saline sinus flushes or a neti pot - Humidifying the air -Tessalon Perles every 8 hours as needed for dry cough      ED Prescriptions     Medication Sig Dispense Auth. Provider   benzonatate (TESSALON) 100 MG capsule Take 1 capsule (100 mg total) by mouth 3 (three) times daily as needed for cough. Do not take with alcohol or while driving or operating heavy machinery.  May cause drowsiness. 21 capsule Valentino Nose, NP      PDMP not reviewed this encounter.   Valentino Nose, NP 03/21/23 1302

## 2023-07-01 ENCOUNTER — Ambulatory Visit (HOSPITAL_COMMUNITY)
Admission: EM | Admit: 2023-07-01 | Discharge: 2023-07-01 | Disposition: A | Discharge status: Home / Self Care | Payer: Managed Care, Other (non HMO) | Attending: Internal Medicine | Admitting: Internal Medicine

## 2023-07-01 ENCOUNTER — Encounter (HOSPITAL_COMMUNITY): Payer: Self-pay | Admitting: Emergency Medicine

## 2023-07-01 ENCOUNTER — Other Ambulatory Visit: Payer: Self-pay

## 2023-07-01 DIAGNOSIS — M25511 Pain in right shoulder: Secondary | ICD-10-CM

## 2023-07-01 DIAGNOSIS — M25512 Pain in left shoulder: Secondary | ICD-10-CM | POA: Diagnosis not present

## 2023-07-01 MED ORDER — CYCLOBENZAPRINE HCL 10 MG PO TABS: 10.0000 mg | ORAL_TABLET | Freq: Two times a day (BID) | ORAL | 0 refills | Status: DC | PRN

## 2023-07-01 MED ORDER — DEXAMETHASONE SODIUM PHOSPHATE 10 MG/ML IJ SOLN
INTRAMUSCULAR | Status: AC
  Filled 2023-07-01: qty 1

## 2023-07-01 MED ORDER — DEXAMETHASONE SODIUM PHOSPHATE 10 MG/ML IJ SOLN
10.0000 mg | Freq: Once | INTRAMUSCULAR | Status: AC
  Administered 2023-07-01: 10 mg via INTRAMUSCULAR

## 2023-07-01 MED ORDER — PREDNISONE 10 MG (21) PO TBPK: ORAL_TABLET | Freq: Every day | ORAL | 0 refills | Status: DC

## 2023-07-01 MED ORDER — KETOROLAC TROMETHAMINE 30 MG/ML IJ SOLN
INTRAMUSCULAR | Status: AC
  Filled 2023-07-01: qty 1

## 2023-07-01 MED ORDER — KETOROLAC TROMETHAMINE 30 MG/ML IJ SOLN
30.0000 mg | Freq: Once | INTRAMUSCULAR | Status: AC
  Administered 2023-07-01: 30 mg via INTRAMUSCULAR

## 2023-07-01 NOTE — Discharge Instructions (Addendum)
Likely bilateral shoulder muscle spasms. Will treat with the following: Toradol injection for pain and steroid injection today for inflammation Prednisone taper to start tomorrow Wednesday, Take 6 tabs by mouth daily for 2 days, then 5 tabs for 2 days, then 4 tabs for 2 days, then 3 tabs for 2 days, 2 tabs for 2 days, then 1 tab by mouth daily for 2 days Flexeril 10 mg every 12 hours as needed for muscle spasms. Use caution as this can make you drowsy.  If symptoms do not improve within about 4-5 days, then may need to consider follow up with orthopedist.  Return to urgent care or PCP if symptoms worsen or fail to resolve.

## 2023-07-01 NOTE — ED Provider Notes (Signed)
[facility]    CSN: [id number] Arrival date & time: 07/01/23  1218      History   Chief Complaint Chief Complaint  Patient presents with   Shoulder Pain    HPI Charles Gomez is a 32 y.o. male.   32 yr old male who presents to urgent care with complaints of bilateral shoulder pain with the right worse then the left. The right side is isolated to the shoulder but on the left there is some radiation down the arm. Denies any injury to the area recently. Denies any changes in activity that would have caused pain. Having trouble sleeping due to pain.  He reports the pain does not start at his neck but starts about mid shoulder.  It is making it difficult for him to move around as well.  He is having trouble at work.   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 Authorizing Provider  benzonatate (TESSALON) 100 MG capsule Take 1 capsule (100 mg total) by mouth 3 (three) times daily as needed for cough. Do not take with alcohol or while driving or operating heavy machinery.  May cause drowsiness. Patient not taking: Reported on 07/01/2023 03/21/23   Valentino Nose, NP  cholecalciferol (VITAMIN D3) 25 MCG (1000 UNIT) tablet Take 2,000 Units by mouth daily. Patient not taking: Reported on 07/01/2023    [provider]  ferrous sulfate 324 MG TBEC Take 324 mg by mouth. Patient not taking:  Reported on 07/01/2023    [provider]  methocarbamol (ROBAXIN) 500 MG tablet Take 1 tablet (500 mg total) by mouth 2 (two) times daily. Patient not taking: Reported on 07/01/2023 06/13/22   Raspet, Noberto Retort, PA-C  vitamin B-12 (CYANOCOBALAMIN) 1000 MCG tablet Take 1,000 mcg by mouth daily. Patient not taking: Reported on 07/01/2023    [provider]    Family History Family History  Problem Relation Age of Onset   Hypertension Mother    Migraines Mother    Healthy Father    Asthma Brother    Asthma Paternal Grandmother     Social History Social History   Tobacco Use   Smoking status: Never   Smokeless tobacco: Never  Vaping Use   Vaping status: Never Used  Substance Use Topics   Alcohol use: Not Currently   Drug use: Never     Allergies   Patient has no known allergies.   Review of Systems Review of Systems  Constitutional:  Negative for chills and fever.  HENT:  Negative for ear pain and sore throat.   Eyes:  Negative for pain and visual disturbance.  Respiratory:  Negative for cough and shortness of breath.   Cardiovascular:  Negative for chest pain and palpitations.  Gastrointestinal:  Negative for abdominal pain and vomiting.  Genitourinary:  Negative  for dysuria and hematuria.  Musculoskeletal:  Negative for arthralgias and back pain.       Bilateral shoulder pain  Skin:  Negative for color change and rash.  Neurological:  Negative for seizures and syncope.  All other systems reviewed and are negative.    Physical Exam Triage Vital Signs ED Triage Vitals  Encounter Vitals Group     BP 07/01/23 1324 136/85     Systolic BP Percentile --      Diastolic BP Percentile --      Pulse Rate 07/01/23 1324 76     Resp 07/01/23 1324 18     Temp 07/01/23 1324 99.7 F (37.6 C)     Temp Source 07/01/23 1324 Oral     SpO2 07/01/23 1324 96 %     Weight --      Height --      Head Circumference --      Peak Flow --      Pain Score 07/01/23  1321 6     Pain Loc --      Pain Education --      Exclude from Growth Chart --    No data found.  Updated Vital Signs BP 136/85 (BP Location: Left Arm)   Pulse 76   Temp 99.7 F (37.6 C) (Oral)   Resp 18   SpO2 96%   Visual Acuity Right Eye Distance:   Left Eye Distance:   Bilateral Distance:    Right Eye Near:   Left Eye Near:    Bilateral Near:     Physical Exam Vitals and nursing note reviewed.  Constitutional:      General: He is not in acute distress.    Appearance: He is well-developed.  HENT:     Head: Normocephalic and atraumatic.  Eyes:     Conjunctiva/sclera: Conjunctivae normal.  Cardiovascular:     Rate and Rhythm: Normal rate and regular rhythm.     Heart sounds: No murmur heard. Pulmonary:     Effort: Pulmonary effort is normal. No respiratory distress.     Breath sounds: Normal breath sounds.  Abdominal:     Palpations: Abdomen is soft.     Tenderness: There is no abdominal tenderness.  Musculoskeletal:        General: Tenderness (Bilateral shoulders) present. No swelling.     Cervical back: Neck supple.     Comments: Decreased range of motion in both shoulders with the right worse than the left  Skin:    General: Skin is warm and dry.     Capillary Refill: Capillary refill takes less than 2 seconds.  Neurological:     Mental Status: He is alert.  Psychiatric:        Mood and Affect: Mood normal.      UC Treatments / Results  Labs (all labs ordered are listed, but only abnormal results are displayed) Labs Reviewed - No data to display  EKG   Radiology No results found.  Procedures Procedures (including critical care time)  Medications Ordered in UC Medications - No data to display  Initial Impression / Assessment and Plan / UC Course  I have reviewed the triage vital signs and the nursing notes.  Pertinent labs & imaging results that were available during my care of the patient were reviewed by me and considered in my  medical decision making (see chart for details).     Acute pain of both shoulders   Likely bilateral shoulder muscle spasms. Will  treat with the following: Toradol injection for pain and steroid injection today for inflammation Prednisone taper to start tomorrow Wednesday, Take 6 tabs by mouth daily for 2 days, then 5 tabs for 2 days, then 4 tabs for 2 days, then 3 tabs for 2 days, 2 tabs for 2 days, then 1 tab by mouth daily for 2 days Flexeril 10 mg every 12 hours as needed for muscle spasms. Use caution as this can make you drowsy.  If symptoms do not improve within about 4-5 days, then may need to consider follow up with orthopedist.  Return to urgent care or PCP if symptoms worsen or fail to resolve.  Final Clinical Impressions(s) / UC Diagnoses   Final diagnoses:  None   Discharge Instructions   None    ED Prescriptions   None    PDMP not reviewed this encounter.   Landis Martins, [place] 07/01/23 1342

## 2023-07-01 NOTE — ED Triage Notes (Signed)
Bilateral shoulder pain on Saturday.  Pain and slight numbness.  Pain has increased.  Reports right arm is worse than left arm.  Patient is right handed.  Denies any injury.  Denies back pain.    Denies taking any medications for symptoms  Reports lifting either arm is painful, but right arm is worse than left arm.  Has recently been using left arm more due to pain in right shoulder

## 2023-07-25 ENCOUNTER — Ambulatory Visit: Payer: Managed Care, Other (non HMO) | Admitting: Gastroenterology

## 2023-07-25 ENCOUNTER — Encounter: Payer: Self-pay | Admitting: Gastroenterology

## 2023-07-25 ENCOUNTER — Other Ambulatory Visit (INDEPENDENT_AMBULATORY_CARE_PROVIDER_SITE_OTHER): Payer: Managed Care, Other (non HMO)

## 2023-07-25 VITALS — BP 130/84 | HR 65 | Ht 72.0 in | Wt 237.2 lb

## 2023-07-25 DIAGNOSIS — D509 Iron deficiency anemia, unspecified: Secondary | ICD-10-CM | POA: Diagnosis not present

## 2023-07-25 DIAGNOSIS — R194 Change in bowel habit: Secondary | ICD-10-CM | POA: Diagnosis not present

## 2023-07-25 DIAGNOSIS — K625 Hemorrhage of anus and rectum: Secondary | ICD-10-CM | POA: Diagnosis not present

## 2023-07-25 LAB — CBC WITH DIFFERENTIAL/PLATELET
Basophils Absolute: 0 10*3/uL (ref 0.0–0.1)
Basophils Relative: 0.6 % (ref 0.0–3.0)
Eosinophils Absolute: 0.3 10*3/uL (ref 0.0–0.7)
Eosinophils Relative: 5.7 % — ABNORMAL HIGH (ref 0.0–5.0)
HCT: 38.4 % — ABNORMAL LOW (ref 39.0–52.0)
Hemoglobin: 12.7 g/dL — ABNORMAL LOW (ref 13.0–17.0)
Lymphocytes Relative: 23.5 % (ref 12.0–46.0)
Lymphs Abs: 1.3 10*3/uL (ref 0.7–4.0)
MCHC: 33 g/dL (ref 30.0–36.0)
MCV: 87.8 fL (ref 78.0–100.0)
Monocytes Absolute: 0.4 10*3/uL (ref 0.1–1.0)
Monocytes Relative: 7.4 % (ref 3.0–12.0)
Neutro Abs: 3.4 10*3/uL (ref 1.4–7.7)
Neutrophils Relative %: 62.8 % (ref 43.0–77.0)
Platelets: 261 10*3/uL (ref 150.0–400.0)
RBC: 4.37 Mil/uL (ref 4.22–5.81)
RDW: 14.1 % (ref 11.5–15.5)
WBC: 5.3 10*3/uL (ref 4.0–10.5)

## 2023-07-25 LAB — IBC + FERRITIN
Ferritin: 116.6 ng/mL (ref 22.0–322.0)
Iron: 63 ug/dL (ref 42–165)
Saturation Ratios: 20.9 % (ref 20.0–50.0)
TIBC: 301 ug/dL (ref 250.0–450.0)
Transferrin: 215 mg/dL (ref 212.0–360.0)

## 2023-07-25 LAB — COMPREHENSIVE METABOLIC PANEL
ALT: 10 U/L (ref 0–53)
AST: 11 U/L (ref 0–37)
Albumin: 4 g/dL (ref 3.5–5.2)
Alkaline Phosphatase: 85 U/L (ref 39–117)
BUN: 26 mg/dL — ABNORMAL HIGH (ref 6–23)
CO2: 28 meq/L (ref 19–32)
Calcium: 10 mg/dL (ref 8.4–10.5)
Chloride: 102 meq/L (ref 96–112)
Creatinine, Ser: 1.6 mg/dL — ABNORMAL HIGH (ref 0.40–1.50)
GFR: 56.64 mL/min — ABNORMAL LOW (ref >60.00)
Glucose, Bld: 80 mg/dL (ref 70–99)
Potassium: 4.2 meq/L (ref 3.5–5.1)
Sodium: 138 meq/L (ref 135–145)
Total Bilirubin: 0.4 mg/dL (ref 0.2–1.2)
Total Protein: 8.4 g/dL — ABNORMAL HIGH (ref 6.0–8.3)

## 2023-07-25 MED ORDER — NA SULFATE-K SULFATE-MG SULF 17.5-3.13-1.6 GM/177ML PO SOLN: 1.0000 | Freq: Once | ORAL | 0 refills | Status: AC

## 2023-07-25 NOTE — Progress Notes (Signed)
I agree with the assessment and plan as outlined by Ms. McMichael. 

## 2023-07-25 NOTE — Progress Notes (Signed)
Chief Complaint: Rectal bleeding Primary GI MD: Dr. Christella Hartigan  HPI: 32 year old male history of iron deficiency anemia (on oral iron), asthma, CKD, presents for evaluation of rectal bleeding.  Last seen in 2021 by Dr. Christella Hartigan.  At that time he was having rectal bleeding.  He was recommended to get a colonoscopy but patient declined.  Recent labs 12/30/2022 - Hgb 13.2, MCV 87.6 - BUN 14, creatinine 1.62  Last iron study was in 2022 with ferritin 28, iron 72, saturation 21%   Discussed the use of AI scribe software for clinical note transcription with the patient, who gave verbal consent to proceed.  He reports seeing blood in his stool 'majority of the time,' estimating it occurs with three to four bowel movements per week. Recently, he has been experiencing constipation, which is a new symptom for him. He also reports having to strain during bowel movements.   Occasionally, he notices an 'orange oil like substance' in his stool, which he sees approximately every other week.   he has not noticed a correlation between this and consumption of fatty or greasy meals. He denies any unplanned weight loss or abdominal pain.   He has a history of anemia, which was noted on labs from April 2024, and he has been told he has internal hemorrhoids. He has not had any surgical procedures for these issues in the past.  He is interested in hemorrhoid banding    Denies family history of colon cancer.  Denies GERD, nausea, vomiting.  Past Medical History:  Diagnosis Date   Anemia    Asthma     Past Surgical History:  Procedure Laterality Date   NO PAST SURGERIES      Current Outpatient Medications  Medication Sig Dispense Refill   cholecalciferol (VITAMIN D3) 25 MCG (1000 UNIT) tablet Take 2,000 Units by mouth daily.     ferrous sulfate 324 MG TBEC Take 324 mg by mouth.     vitamin B-12 (CYANOCOBALAMIN) 1000 MCG tablet Take 1,000 mcg by mouth daily.     No current facility-administered  medications for this visit.    Allergies as of 07/25/2023   (No Known Allergies)    Family History  Problem Relation Age of Onset   Hypertension Mother    Migraines Mother    Healthy Father    Asthma Brother    Asthma Paternal Grandmother     Social History   Socioeconomic History   Marital status: Single    Spouse name: Not on file   Number of children: 0   Years of education: Not on file   Highest education level: Not on file  Occupational History   Occupation: work in a lab and post office  Tobacco Use   Smoking status: Never   Smokeless tobacco: Never  Vaping Use   Vaping status: Never Used  Substance and Sexual Activity   Alcohol use: Not Currently   Drug use: Never   Sexual activity: Not on file  Other Topics Concern   Not on file  Social History Narrative   Not on file   Social Determinants of Health   Financial Resource Strain: Low Risk  (12/16/2019)   Received from [facility] visits prior to 11/02/2022., [facility] visits prior to 11/02/2022.   Overall Financial Resource Strain (CARDIA)    Difficulty of Paying Living Expenses: Not very hard  Food Insecurity: No Food Insecurity (12/16/2019)   [facility]  prior to 11/02/2022., [facility] visits prior to 11/02/2022.   Hunger Vital Sign    Worried About Running Out of Food in the Last Year: Never true    Ran Out of Food in the Last Year: Never true  Transportation Needs: No Transportation Needs (12/16/2019)   Received from [facility] visits prior to 11/02/2022., [facility] visits prior to 11/02/2022.   PRAPARE - Administrator, Civil Service (Medical): No    Lack of Transportation (Non-Medical): No  Physical Activity: Sufficiently Active (12/16/2019)   Received from [facility] visits prior to 11/02/2022., [facility] visits prior to 11/02/2022.   Exercise Vital Sign    Days of Exercise per Week: 7 days    Minutes of Exercise per Session: 120 min  Stress: No Stress Concern Present (12/16/2019)   [facility] prior to 11/02/2022., [facility] visits prior to 11/02/2022.   [employer] of Occupational Health - Occupational Stress Questionnaire    Feeling of Stress : Not at all  Social Connections: Socially Isolated (12/16/2019)   [facility] prior to 11/02/2022., [facility] visits prior to 11/02/2022.   Social Advertising account executive [NHANES]    Frequency of Communication with Friends and Family: Once a week    Frequency of Social Gatherings with Friends and Family: Once a week    Attends Religious Services: Never    Database administrator or Organizations: No    Attends Banker Meetings: Never    Marital Status: Never married  Intimate Partner Violence: Not At Risk (12/16/2019)   [facility] prior to 11/02/2022., [facility] visits prior to 11/02/2022.   Humiliation, Afraid, Rape, and Kick questionnaire    Fear of Current or Ex-Partner: No    Emotionally Abused: No    Physically Abused: No    Sexually Abused: No    Review of Systems:    Constitutional: No weight loss, fever, chills, weakness or fatigue HEENT: Eyes: No change in vision               Ears, Nose, Throat:  No change in hearing or congestion Skin: No rash or itching Cardiovascular: No chest pain, chest pressure or palpitations   Respiratory: No SOB or cough Gastrointestinal: See HPI and otherwise negative Genitourinary: No dysuria or change in urinary frequency Neurological: No headache, dizziness or syncope Musculoskeletal: No new muscle or joint pain Hematologic: No bleeding or bruising Psychiatric: No history of  depression or anxiety    Physical Exam:  Vital signs: BP 130/84   Pulse 65   Ht 6' (1.829 m)   Wt 237 lb 3.2 oz (107.6 kg)   BMI 32.17 kg/m   Constitutional: NAD, Well developed, Well nourished, alert and cooperative Head:  Normocephalic and atraumatic. Eyes:   PEERL, EOMI. No icterus. Conjunctiva pink. Respiratory: Respirations even and unlabored. Lungs clear to auscultation bilaterally.   No wheezes, crackles, or rhonchi.  Cardiovascular:  Regular rate and rhythm. No peripheral edema, cyanosis or pallor.  Gastrointestinal:  Soft, nondistended, nontender. No rebound or guarding. Normal bowel sounds. No appreciable masses or hepatomegaly. Rectal:  Not performed.  Defer to colonoscopy Msk:  Symmetrical without gross deformities. Without edema, no deformity or joint abnormality.  Neurologic:  Alert  and  oriented x4;  grossly normal neurologically.  Skin:   Dry and intact without significant lesions or rashes. Psychiatric: Oriented to person, place and time. Demonstrates good judgement and reason without abnormal affect or behaviors.   RELEVANT LABS AND IMAGING: CBC    Component Value Date/Time   WBC 3.4 (L) 01/12/2021 1347   RBC 4.57 01/12/2021 1347   HGB 13.5 01/12/2021 1347   HCT 40.0 01/12/2021 1347   PLT 146 (L) 01/12/2021 1347   MCV 87.5 01/12/2021 1347   MCH 29.5 01/12/2021 1347   MCHC 33.8 01/12/2021 1347   RDW 12.5 01/12/2021 1347   LYMPHSABS 1.2 01/12/2021 1347   MONOABS 0.4 01/12/2021 1347   EOSABS 0.2 01/12/2021 1347   BASOSABS 0.0 01/12/2021 1347    CMP  No results found for: "NA", "K", "CL", "CO2", "GLUCOSE", "BUN", "CREATININE", "CALCIUM", "PROT", "ALBUMIN", "AST", "ALT", "ALKPHOS", "BILITOT", "GFRNONAA", "GFRAA"   Assessment/Plan:      Rectal Bleeding Chronic rectal bleeding with recent increase in frequency. Likely due to internal hemorrhoids, but requires further evaluation due to the chronicity and frequency of bleeding. Patient states he needs  procedure done by the end of the year for insurance purposes. --Schedule colonoscopy  --If hemorrhoids are confirmed, schedule banding procedures for after colonoscopy -- I thoroughly discussed the procedure with the patient (at bedside) to include nature of the procedure, alternatives, benefits, and risks (including but not limited to bleeding, infection, perforation, anesthesia/cardiac pulmonary complications).  Patient verbalized understanding and gave verbal consent to proceed with procedure.  -- 2 day prep with history of constipation  Constipation Recent onset constipation with straining during bowel movements. Likely secondary to his oral iron use. --Start daily Miralax for constipation a adjust dose based on response. --Recommend use of a Squatty Potty to aid in bowel movements and potentially reduce hemorrhoid symptoms.  Chronic Kidney Disease Stable chronic kidney disease, potentially contributing to chronic anemia. -Continue current management.  Anemia Chronic anemia, potentially multifactorial due to chronic kidney disease and chronic rectal bleeding. On oral iron. No iron labs since 2022. Hgb stable. -- CBC, CMP, iron/ferritin -- EGD/colonoscopy for further evaluation of IDA -- I thoroughly discussed the procedure with the patient (at bedside) to include nature of the procedure, alternatives, benefits, and risks (including but not limited to bleeding, infection, perforation, anesthesia/cardiac pulmonary complications).  Patient verbalized understanding and gave verbal consent to proceed with procedure.   Lara Mulch [facility] 07/25/2023, 11:00 AM  Cc: Verlon Au, MD

## 2023-07-25 NOTE — Patient Instructions (Addendum)
Start taking Miralax 1 capful (17 grams) 1x / day for 1 week.   If this is not effective, increase to 1 dose 2x / day for 1 week.   If this is still not effective, increase to two capfuls (34 grams) 2x / day.   Can adjust dose as needed based on response. Can take 1/2 cap daily, skip days, or increase per day.    Your provider has requested that you go to the basement level for lab work before leaving today. Press "B" on the elevator. The lab is located at the first door on the left as you exit the elevator.   You have been scheduled for a colonoscopy. Please follow written instructions given to you at your visit today.   Please pick up your prep supplies at the pharmacy within the next 1-3 days.  If you use inhalers (even only as needed), please bring them with you on the day of your procedure.  DO NOT TAKE 7 DAYS PRIOR TO TEST- Trulicity (dulaglutide) Ozempic, Wegovy (semaglutide) Mounjaro (tirzepatide) Bydureon Bcise (exanatide extended release)  DO NOT TAKE 1 DAY PRIOR TO YOUR TEST Rybelsus (semaglutide) Adlyxin (lixisenatide) Victoza (liraglutide) Byetta (exanatide) ___________________________________________________________________  _______________________________________________________  If your blood pressure at your visit was 140/90 or greater, please contact your primary care physician to follow up on this.  _______________________________________________________  If you are age 36 or older, your body mass index should be between 23-30. Your Body mass index is 32.17 kg/m. If this is out of the aforementioned range listed, please consider follow up with your Primary Care Provider.  If you are age 80 or younger, your body mass index should be between 19-25. Your Body mass index is 32.17 kg/m. If this is out of the aformentioned range listed, please consider follow up with your Primary Care Provider.   ________________________________________________________  The  [facility] providers would like to encourage you to use [facility] to communicate with providers for non-urgent requests or questions.  Due to long hold times on the telephone, sending your provider a message by [facility] may be a faster and more efficient way to get a response.  Please allow 48 business hours for a response.  Please remember that this is for non-urgent requests.  _______________________________________________________

## 2023-07-29 ENCOUNTER — Encounter: Payer: Managed Care, Other (non HMO) | Admitting: Internal Medicine

## 2023-08-24 ENCOUNTER — Encounter: Payer: Self-pay | Admitting: Certified Registered Nurse Anesthetist

## 2023-08-26 ENCOUNTER — Ambulatory Visit: Payer: Managed Care, Other (non HMO) | Admitting: Internal Medicine

## 2023-08-26 ENCOUNTER — Encounter: Payer: Self-pay | Admitting: Internal Medicine

## 2023-08-26 VITALS — BP 118/81 | HR 64 | Temp 97.9°F | Resp 14 | Ht 72.0 in | Wt 237.0 lb

## 2023-08-26 DIAGNOSIS — D509 Iron deficiency anemia, unspecified: Secondary | ICD-10-CM

## 2023-08-26 DIAGNOSIS — K2980 Duodenitis without bleeding: Secondary | ICD-10-CM

## 2023-08-26 DIAGNOSIS — K625 Hemorrhage of anus and rectum: Secondary | ICD-10-CM

## 2023-08-26 DIAGNOSIS — R194 Change in bowel habit: Secondary | ICD-10-CM

## 2023-08-26 DIAGNOSIS — K648 Other hemorrhoids: Secondary | ICD-10-CM | POA: Diagnosis present

## 2023-08-26 MED ORDER — HYDROCORTISONE (PERIANAL) 2.5 % EX CREA: TOPICAL_CREAM | CUTANEOUS | 0 refills | Status: DC

## 2023-08-26 MED ORDER — SODIUM CHLORIDE 0.9 % IV SOLN: 500.0000 mL | Freq: Once | INTRAVENOUS | Status: AC

## 2023-08-26 NOTE — Progress Notes (Unsigned)
Report given to PACU, vss 

## 2023-08-26 NOTE — Progress Notes (Unsigned)
GASTROENTEROLOGY PROCEDURE H&P NOTE   Primary Care Physician: Verlon Au, MD    Reason for Procedure:   Rectal bleeding, IDA  Plan:    EGD/colonoscopy  Patient is appropriate for endoscopic procedure(s) in the ambulatory ([facility]) setting.  The nature of the procedure, as well as the risks, benefits, and alternatives were carefully and thoroughly reviewed with the patient. Ample time for discussion and questions allowed. The patient understood, was satisfied, and agreed to proceed.     HPI: Charles Gomez is a 32 y.o. male who presents for EGD/colonoscopy for evaluation of rectal bleeding and IDA .  Patient was most recently seen in the Gastroenterology Clinic on 07/25/23.  No interval change in medical history since that appointment. Please refer to that note for full details regarding GI history and clinical presentation.   Past Medical History:  Diagnosis Date   Anemia    Asthma     Past Surgical History:  Procedure Laterality Date   NO PAST SURGERIES      Prior to Admission medications   Medication Sig Start Date End Date Taking? Authorizing Provider  cholecalciferol (VITAMIN D3) 25 MCG (1000 UNIT) tablet Take 2,000 Units by mouth daily.   Yes [provider]  ferrous sulfate 324 MG TBEC Take 324 mg by mouth.   Yes [provider]  vitamin B-12 (CYANOCOBALAMIN) 1000 MCG tablet Take 1,000 mcg by mouth daily.   Yes [provider]    Current Outpatient Medications  Medication Sig Dispense Refill   cholecalciferol (VITAMIN D3) 25 MCG (1000 UNIT) tablet Take 2,000 Units by mouth daily.     ferrous sulfate 324 MG TBEC Take 324 mg by mouth.     vitamin B-12 (CYANOCOBALAMIN) 1000 MCG tablet Take 1,000 mcg by mouth daily.     Current Facility-Administered Medications  Medication Dose Route Frequency Provider Last Rate Last Admin   0.9 %  sodium chloride infusion  500 mL Intravenous Once Imogene Burn, MD        Allergies as of  08/26/2023   (No Known Allergies)    Family History  Problem Relation Age of Onset   Hypertension Mother    Migraines Mother    Healthy Father    Asthma Brother    Asthma Paternal Grandmother    Colon cancer Neg Hx    Esophageal cancer Neg Hx    Rectal cancer Neg Hx    Stomach cancer Neg Hx     Social History   Socioeconomic History   Marital status: Single    Spouse name: Not on file   Number of children: 0   Years of education: Not on file   Highest education level: Not on file  Occupational History   Occupation: work in a lab and post office  Tobacco Use   Smoking status: Never   Smokeless tobacco: Never  Vaping Use   Vaping status: Never Used  Substance and Sexual Activity   Alcohol use: Not Currently   Drug use: Never   Sexual activity: Not on file  Other Topics Concern   Not on file  Social History Narrative   Not on file   Social Drivers of Health   Financial Resource Strain: Low Risk  (12/16/2019)   [facility] visits prior to 11/02/2022., [facility] visits prior to 11/02/2022.   Overall Financial Resource Strain (CARDIA)    Difficulty of Paying Living Expenses: Not very hard  Food Insecurity:  No Food Insecurity (12/16/2019)   Received from [facility] visits prior to 11/02/2022., [facility] visits prior to 11/02/2022.   Hunger Vital Sign    Worried About Running Out of Food in the Last Year: Never true    Ran Out of Food in the Last Year: Never true  Transportation Needs: No Transportation Needs (12/16/2019)   Received from [facility] visits prior to 11/02/2022., [facility] visits prior to 11/02/2022.   PRAPARE - Administrator, Civil Service (Medical): No    Lack of Transportation (Non-Medical): No  Physical Activity: Sufficiently Active (12/16/2019)   Received from [facility] visits  prior to 11/02/2022., [facility] visits prior to 11/02/2022.   Exercise Vital Sign    Days of Exercise per Week: 7 days    Minutes of Exercise per Session: 120 min  Stress: No Stress Concern Present (12/16/2019)   [facility] prior to 11/02/2022., [facility] visits prior to 11/02/2022.   [employer] of Occupational Health - Occupational Stress Questionnaire    Feeling of Stress : Not at all  Social Connections: Socially Isolated (12/16/2019)   [facility] prior to 11/02/2022., [facility] visits prior to 11/02/2022.   Social Advertising account executive [NHANES]    Frequency of Communication with Friends and Family: Once a week    Frequency of Social Gatherings with Friends and Family: Once a week    Attends Religious Services: Never    Database administrator or Organizations: No    Attends Banker Meetings: Never    Marital Status: Never married  Intimate Partner Violence: Not At Risk (12/16/2019)   [facility] prior to 11/02/2022., [facility] visits prior to 11/02/2022.   Humiliation, Afraid, Rape, and Kick questionnaire    Fear of Current or Ex-Partner: No    Emotionally Abused: No    Physically Abused: No    Sexually Abused: No    Physical Exam: Vital signs in last 24 hours: BP 133/79   Pulse 72   Temp 97.9 F (36.6 C) (Skin)   Ht 6' (1.829 m)   Wt 237 lb (107.5 kg)   SpO2 99%   BMI 32.14 kg/m  GEN: NAD EYE: Sclerae anicteric ENT: MMM CV: Non-tachycardic Pulm: No increased WOB GI: Soft NEURO:  Alert & Oriented   Eulah Pont, MD [facility]   08/26/2023 11:13 AM

## 2023-08-26 NOTE — Progress Notes (Unsigned)
1126 Robinul 0.1 mg IV given due large amount of secretions upon assessment.  MD made aware, vss 

## 2023-08-26 NOTE — Progress Notes (Signed)
Called to room to assist during endoscopic procedure.  Patient ID and intended procedure confirmed with present staff. Received instructions for my participation in the procedure from the performing physician.  

## 2023-08-26 NOTE — Op Note (Addendum)
Charles Gomez Patient Name: Charles Gomez Procedure Date: 08/26/2023 11:11 AM MRN: [id number] Endoscopist: Particia Lather , , [id number] Age: 32 Referring MD:  Date of Birth: [birth year] Gender: Male Account #: [id number] Procedure:                Colonoscopy Indications:              Rectal bleeding, Iron deficiency anemia Medicines:                Monitored Anesthesia Care Procedure:                Pre-Anesthesia Assessment:                           - Prior to the procedure, a History and Physical                            was performed, and patient medications and                            allergies were reviewed. The patient's tolerance of                            previous anesthesia was also reviewed. The risks                            and benefits of the procedure and the sedation                            options and risks were discussed with the patient.                            All questions were answered, and informed consent                            was obtained. Prior Anticoagulants: The patient has                            taken no anticoagulant or antiplatelet agents. ASA                            Grade Assessment: II - A patient with mild systemic                            disease. After reviewing the risks and benefits,                            the patient was deemed in satisfactory condition to                            undergo the procedure.                           After obtaining informed consent, the colonoscope  was passed under direct vision. Throughout the                            procedure, the patient's blood pressure, pulse, and                            oxygen saturations were monitored continuously. The                            CF [id number] #[id number] was introduced through the anus                            and advanced to the the terminal ileum. The                            colonoscopy was  performed without difficulty. The                            patient tolerated the procedure well. The quality                            of the bowel preparation was good. The terminal                            ileum, ileocecal valve, appendiceal orifice, and                            rectum were photographed. Scope In: 11:41:47 AM Scope Out: 11:58:18 AM Scope Withdrawal Time: 0 hours 10 minutes 30 seconds  Total Procedure Duration: 0 hours 16 minutes 31 seconds  Findings:                 The terminal ileum appeared normal.                           Non-bleeding internal hemorrhoids were found during                            retroflexion. Complications:            No immediate complications. Estimated Blood Loss:     Estimated blood loss: none. Impression:               - The examined portion of the ileum was normal.                           - Non-bleeding internal hemorrhoids.                           - No specimens collected. Recommendation:           - Discharge patient to home (with escort).                           - Anusol HC cream BID for 7 days.                           -  Your iron deficiency has improved on your last                            set of labs. Okay to follow up with [place] or PCP to                            trend blood counts.                           - The findings and recommendations were discussed                            with the patient. Dr Particia Lather "[name]" [name],  08/26/2023 12:07:56 PM

## 2023-08-26 NOTE — Op Note (Signed)
[facility] Patient Name: Charles Gomez Procedure Date: 08/26/2023 11:23 AM MRN: [id number] Endoscopist: Charles Gomez [name] , , [id number] Age: 32 Referring MD:  Date of Birth: 01/25/1991 Gender: Male Account #: [id number] Procedure:                Upper GI endoscopy Indications:              Iron deficiency anemia Medicines:                Monitored Anesthesia Care Procedure:                Pre-Anesthesia Assessment:                           - Prior to the procedure, a History and Physical                            was performed, and patient medications and                            allergies were reviewed. The patient's tolerance of                            previous anesthesia was also reviewed. The risks                            and benefits of the procedure and the sedation                            options and risks were discussed with the patient.                            All questions were answered, and informed consent                            was obtained. Prior Anticoagulants: The patient has                            taken no anticoagulant or antiplatelet agents. ASA                            Grade Assessment: II - A patient with mild systemic                            disease. After reviewing the risks and benefits,                            the patient was deemed in satisfactory condition to                            undergo the procedure.                           After obtaining informed consent, the endoscope was  passed under direct vision. Throughout the                            procedure, the patient's blood pressure, pulse, and                            oxygen saturations were monitored continuously. The                            Olympus Scope [id number] was introduced through the                            mouth, and advanced to the second part of duodenum.                            The upper GI endoscopy  was accomplished without                            difficulty. The patient tolerated the procedure                            well. Scope In: Scope Out: Findings:                 The examined esophagus was normal.                           The entire examined stomach was normal. Biopsies                            were taken with a cold forceps for histology.                           Localized mild inflammation characterized by                            congestion (edema) and erythema was found in the                            duodenal bulb. Biopsies were taken with a cold                            forceps for histology. Complications:            No immediate complications. Estimated Blood Loss:     Estimated blood loss was minimal. Impression:               - Normal esophagus.                           - Normal stomach. Biopsied.                           - Duodenitis. Biopsied. Recommendation:           - Await pathology results.                           -  Perform a colonoscopy today. Dr Charles Gomez "Charles Gomez" Charles Gomez,  08/26/2023 12:06:17 PM

## 2023-08-26 NOTE — Patient Instructions (Signed)
Discharge instructions given. Handout on Hemorrhoids given. Prescription sent to pharmacy. Resume previous medications. YOU HAD AN ENDOSCOPIC PROCEDURE TODAY AT THE [facility]:   Refer to the procedure report that was given to you for any specific questions about what was found during the examination.  If the procedure report does not answer your questions, please call your gastroenterologist to clarify.  If you requested that your care partner not be given the details of your procedure findings, then the procedure report has been included in a sealed envelope for you to review at your convenience later.  YOU SHOULD EXPECT: Some feelings of bloating in the abdomen. Passage of more gas than usual.  Walking can help get rid of the air that was put into your GI tract during the procedure and reduce the bloating. If you had a lower endoscopy (such as a colonoscopy or flexible sigmoidoscopy) you may notice spotting of blood in your stool or on the toilet paper. If you underwent a bowel prep for your procedure, you may not have a normal bowel movement for a few days.  Please Note:  You might notice some irritation and congestion in your nose or some drainage.  This is from the oxygen used during your procedure.  There is no need for concern and it should clear up in a day or so.  SYMPTOMS TO REPORT IMMEDIATELY:  Following lower endoscopy (colonoscopy or flexible sigmoidoscopy):  Excessive amounts of blood in the stool  Significant tenderness or worsening of abdominal pains  Swelling of the abdomen that is new, acute  Fever of 100F or higher  Following upper endoscopy (EGD)  Vomiting of blood or coffee ground material  New chest pain or pain under the shoulder blades  Painful or persistently difficult swallowing  New shortness of breath  Fever of 100F or higher  Black, tarry-looking stools  For urgent or emergent issues, a gastroenterologist can be reached at any hour by calling  [phone]. Do not use MyChart messaging for urgent concerns.    DIET:  We do recommend a small meal at first, but then you may proceed to your regular diet.  Drink plenty of fluids but you should avoid alcoholic beverages for 24 hours.  ACTIVITY:  You should plan to take it easy for the rest of today and you should NOT DRIVE or use heavy machinery until tomorrow (because of the sedation medicines used during the test).    FOLLOW UP: Our staff will call the number listed on your records the next business day following your procedure.  We will call around 7:15- 8:00 am to check on you and address any questions or concerns that you may have regarding the information given to you following your procedure. If we do not reach you, we will leave a message.     If any biopsies were taken you will be contacted by phone or by letter within the next 1-3 weeks.  Please call us at [phone] if you have not heard about the biopsies in 3 weeks.    SIGNATURES/CONFIDENTIALITY: You and/or your care partner have signed paperwork which will be entered into your electronic medical record.  These signatures attest to the fact that that the information above on your After Visit Summary has been reviewed and is understood.  Full responsibility of the confidentiality of this discharge information lies with you and/or your care-partner.

## 2023-08-26 NOTE — Progress Notes (Unsigned)
Patient states there have been no changes to medical or surgical history since time of pre-visit. 

## 2023-08-29 ENCOUNTER — Telehealth: Payer: Self-pay

## 2023-08-29 NOTE — Telephone Encounter (Signed)
  Follow up Call-     08/26/2023   10:56 AM  Call back number  Post procedure Call Back phone  # [phone]  Permission to leave phone message Yes     Patient questions:  Do you have a fever, pain , or abdominal swelling? No. Pain Score  0 *  Have you tolerated food without any problems? Yes.    Have you been able to return to your normal activities? Yes.    Do you have any questions about your discharge instructions: Diet   No. Medications  No. Follow up visit  No.  Do you have questions or concerns about your Care? No.  Actions: * If pain score is 4 or above: No action needed, pain <4.

## 2023-09-01 ENCOUNTER — Encounter: Payer: Self-pay | Admitting: Internal Medicine

## 2023-09-01 LAB — SURGICAL PATHOLOGY

## 2023-10-08 ENCOUNTER — Encounter: Payer: Self-pay | Admitting: Internal Medicine

## 2023-10-08 ENCOUNTER — Ambulatory Visit: Payer: Managed Care, Other (non HMO) | Admitting: Internal Medicine

## 2023-10-08 VITALS — BP 122/70 | HR 83 | Ht 72.0 in | Wt 263.0 lb

## 2023-10-08 DIAGNOSIS — K641 Second degree hemorrhoids: Secondary | ICD-10-CM | POA: Diagnosis not present

## 2023-10-08 DIAGNOSIS — K649 Unspecified hemorrhoids: Secondary | ICD-10-CM

## 2023-10-08 NOTE — Patient Instructions (Signed)
 Drink 8 cups of water a day and walk 30 minutes a day.  Please purchase the following medications over the counter and take as directed: Miralax: Take as  directed up to 3 times a day to achieve regular bowel movements  HEMORRHOID BANDING PROCEDURE    FOLLOW-UP CARE   The procedure you have had should have been relatively painless since the banding of the area involved does not have nerve endings and there is no pain sensation.  The rubber band cuts off the blood supply to the hemorrhoid and the band may fall off as soon as 48 hours after the banding (the band may occasionally be seen in the toilet bowl following a bowel movement). You may notice a temporary feeling of fullness in the rectum which should respond adequately to plain Tylenol or Motrin.  Following the banding, avoid strenuous exercise that evening and resume full activity the next day.  A sitz bath (soaking in a warm tub) or bidet is soothing, and can be useful for cleansing the area after bowel movements.     To avoid constipation, take two tablespoons of natural wheat bran, natural oat bran, flax, Benefiber or any over the counter fiber supplement and increase your water intake to 7-8 glasses daily.    Unless you have been prescribed anorectal medication, do not put anything inside your rectum for two weeks: No suppositories, enemas, fingers, etc.  Occasionally, you may have more bleeding than usual after the banding procedure.  This is often from the untreated hemorrhoids rather than the treated one.  Don't be concerned if there is a tablespoon or so of blood.  If there is more blood than this, lie flat with your bottom higher than your head and apply an ice pack to the area. If the bleeding does not stop within a half an hour or if you feel faint, call our office at [phone] or go to the emergency room.  Problems are not common; however, if there is a substantial amount of bleeding, severe pain, chills, fever or  difficulty passing urine (very rare) or other problems, you should call us  at [phone] or report to the nearest emergency room.  Do not stay seated continuously for more than 2-3 hours for a day or two after the procedure.  Tighten your buttock muscles 10-15 times every two hours and take 10-15 deep breaths every 1-2 hours.  Do not spend more than a few minutes on the toilet if you cannot empty your bowel; instead re-visit the toilet at a later time.   Thank you for entrusting me with your care and for choosing [facility], Dr. Estefana Kidney

## 2023-10-08 NOTE — Progress Notes (Signed)
 PROCEDURE NOTE: The patient presents with symptomatic grade 2  hemorrhoids, requesting rubber band ligation of his/her hemorrhoidal disease.  All risks, benefits and alternative forms of therapy were described and informed consent was obtained.  In the Left Lateral Decubitus position anoscopic examination revealed grade 2 hemorrhoids and significant stool burden in the rectal vault. The anorectum was pre-medicated with nitroglycerin and Recticare The decision was made to band the right anterior internal hemorrhoid, and the [facility]'Reg[facility] System was used to perform band ligation without complication.  Digital anorectal examination was then performed to assure proper positioning of the band, and to adjust the banded tissue as required.  The patient was discharged home without pain or other issues.  Dietary and behavioral recommendations were given and along with follow-up instructions.     The following adjunctive treatments were recommended: Daily Miralax  The patient will return in 4 weeks for  follow-up and possible additional banding as required. No complications were encountered and the patient tolerated the procedure well.

## 2023-11-13 ENCOUNTER — Encounter: Payer: Managed Care, Other (non HMO) | Admitting: Internal Medicine

## 2023-11-17 ENCOUNTER — Ambulatory Visit: Payer: Managed Care, Other (non HMO) | Admitting: Internal Medicine

## 2023-11-17 ENCOUNTER — Encounter: Payer: Self-pay | Admitting: Internal Medicine

## 2023-11-17 VITALS — BP 138/88 | HR 76 | Ht 73.0 in | Wt 265.0 lb

## 2023-11-17 DIAGNOSIS — K641 Second degree hemorrhoids: Secondary | ICD-10-CM

## 2023-11-17 DIAGNOSIS — K649 Unspecified hemorrhoids: Secondary | ICD-10-CM

## 2023-11-17 NOTE — Progress Notes (Signed)
 PROCEDURE NOTE: The patient presents with symptomatic grade 2  hemorrhoids, requesting rubber band ligation of his/her hemorrhoidal disease.  All risks, benefits and alternative forms of therapy were described and informed consent was obtained.  Constipation is better on Miralax PRN. He feels like the last banding session helped and he only had a little bit of rectal bleeding since then.  In the Left Lateral Decubitus position anoscopic examination revealed grade 2 hemorrhoids and significant stool burden in the rectal vault. The anorectum was pre-medicated with nitroglycerin and Recticare The decision was made to band the posterior internal hemorrhoid, and the [facility] O'Regan System was used to perform band ligation without complication.  Digital anorectal examination was then performed to assure proper positioning of the band, and to adjust the banded tissue as required.  The patient was discharged home without pain or other issues.  Dietary and behavioral recommendations were given and along with follow-up instructions.     The following adjunctive treatments were recommended: Continue Miralax PRN  The patient will return PRN or follow-up and possible additional banding as required. No complications were encountered and the patient tolerated the procedure well.

## 2023-11-17 NOTE — Patient Instructions (Signed)
 HEMORRHOID BANDING PROCEDURE    FOLLOW-UP CARE   The procedure you have had should have been relatively painless since the banding of the area involved does not have nerve endings and there is no pain sensation.  The rubber band cuts off the blood supply to the hemorrhoid and the band may fall off as soon as 48 hours after the banding (the band may occasionally be seen in the toilet bowl following a bowel movement). You may notice a temporary feeling of fullness in the rectum which should respond adequately to plain Tylenol or Motrin.  Following the banding, avoid strenuous exercise that evening and resume full activity the next day.  A sitz bath (soaking in a warm tub) or bidet is soothing, and can be useful for cleansing the area after bowel movements.     To avoid constipation, take two tablespoons of natural wheat bran, natural oat bran, flax, Benefiber or any over the counter fiber supplement and increase your water intake to 7-8 glasses daily.    Unless you have been prescribed anorectal medication, do not put anything inside your rectum for two weeks: No suppositories, enemas, fingers, etc.  Occasionally, you may have more bleeding than usual after the banding procedure.  This is often from the untreated hemorrhoids rather than the treated one.  Don't be concerned if there is a tablespoon or so of blood.  If there is more blood than this, lie flat with your bottom higher than your head and apply an ice pack to the area. If the bleeding does not stop within a half an hour or if you feel faint, call our office at [phone] or go to the emergency room.  Problems are not common; however, if there is a substantial amount of bleeding, severe pain, chills, fever or difficulty passing urine (very rare) or other problems, you should call us at [phone] or report to the nearest emergency room.  Do not stay seated continuously for more than 2-3 hours for a day or two after the procedure.   Tighten your buttock muscles 10-15 times every two hours and take 10-15 deep breaths every 1-2 hours.  Do not spend more than a few minutes on the toilet if you cannot empty your bowel; instead re-visit the toilet at a later time.  Follow Up as needed _______________________________________________________  If your blood pressure at your visit was 140/90 or greater, please contact your primary care physician to follow up on this.  _______________________________________________________  If you are age 80 or older, your body mass index should be between 23-30. Your Body mass index is 34.96 kg/m. If this is out of the aforementioned range listed, please consider follow up with your Primary Care Provider.  If you are age 63 or younger, your body mass index should be between 19-25. Your Body mass index is 34.96 kg/m. If this is out of the aformentioned range listed, please consider follow up with your Primary Care Provider.   ________________________________________________________  The [facility] providers would like to encourage you to use [facility] to communicate with providers for non-urgent requests or questions.  Due to long hold times on the telephone, sending your provider a message by [facility] may be a faster and more efficient way to get a response.  Please allow 48 business hours for a response.  Please remember that this is for non-urgent requests.  _______________________________________________________  Thank you for entrusting me with your care and for choosing [facility], Dr. Eulah Pont

## 2023-11-28 ENCOUNTER — Encounter: Payer: Self-pay | Admitting: Internal Medicine

## 2023-11-28 ENCOUNTER — Ambulatory Visit: Payer: Managed Care, Other (non HMO) | Admitting: Internal Medicine

## 2023-11-28 VITALS — BP 118/78 | HR 65 | Ht 73.0 in | Wt 267.0 lb

## 2023-11-28 DIAGNOSIS — D649 Anemia, unspecified: Secondary | ICD-10-CM | POA: Diagnosis not present

## 2023-11-28 DIAGNOSIS — K649 Unspecified hemorrhoids: Secondary | ICD-10-CM | POA: Diagnosis not present

## 2023-11-28 DIAGNOSIS — K5900 Constipation, unspecified: Secondary | ICD-10-CM | POA: Diagnosis not present

## 2023-11-28 NOTE — Patient Instructions (Signed)
 Glad you are feeling better  Follow up as needed    _______________________________________________________  If your blood pressure at your visit was 140/90 or greater, please contact your primary care physician to follow up on this.  _______________________________________________________  If you are age 33 or older, your body mass index should be between 23-30. Your Body mass index is 35.23 kg/m. If this is out of the aforementioned range listed, please consider follow up with your Primary Care Provider.  If you are age 57 or younger, your body mass index should be between 19-25. Your Body mass index is 35.23 kg/m. If this is out of the aformentioned range listed, please consider follow up with your Primary Care Provider.   ________________________________________________________  The [facility] providers would like to encourage you to use [facility] to communicate with providers for non-urgent requests or questions.  Due to long hold times on the telephone, sending your provider a message by [facility] may be a faster and more efficient way to get a response.  Please allow 48 business hours for a response.  Please remember that this is for non-urgent requests.  _______________________________________________________   Thank you for entrusting me with your care and for choosing [facility],  Dr. Eulah Pont

## 2023-11-28 NOTE — Progress Notes (Signed)
 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   Interval History: After his hemorrhoidal banding sessions, he is not seeing any rectal bleeding any longer. Denies rectal pain. He does take Miralax PRN. Denies constipation most of the time. He has on average one BM per day. Denies ab pain. He is eating and drinking well.   Past Medical History:  Diagnosis Date   Anemia    Asthma     Past Surgical History:  Procedure Laterality Date   COLONOSCOPY     ESOPHAGOGASTRODUODENOSCOPY      Current Outpatient Medications  Medication Sig Dispense Refill   cholecalciferol (VITAMIN D3) 25 MCG (1000 UNIT) tablet Take 2,000 Units by mouth daily.     ferrous sulfate 324 MG TBEC Take 324 mg by mouth.     vitamin B-12 (CYANOCOBALAMIN) 1000 MCG tablet Take 1,000 mcg by mouth daily.     Current Facility-Administered Medications  Medication Dose Route Frequency Provider Last Rate Last Admin   0.9 %  sodium chloride infusion  500 mL Intravenous Once Imogene Burn, MD        Allergies as of 11/28/2023   (No Known Allergies)    Family History  Problem Relation Age of Onset   Hypertension Mother    Migraines Mother    Healthy Father    Asthma Brother    Asthma Paternal Grandmother    Colon cancer Neg Hx    Esophageal cancer Neg Hx    Rectal cancer Neg Hx    Stomach cancer Neg Hx     Social History   Socioeconomic History   Marital status: Single    Spouse name: Not on file   Number of children: 0   Years of education: Not on file   Highest education level: Not on file  Occupational History   Occupation: work in a lab and post office  Tobacco Use   Smoking status: Never   Smokeless tobacco: Never  Vaping Use   Vaping status: Never Used  Substance and Sexual Activity   Alcohol use:  Not Currently   Drug use: Never   Sexual activity: Not on file  Other Topics Concern   Not on file  Social History Narrative   Not on file   Social Drivers of Health   Financial Resource Strain: Low Risk  (12/16/2019)   [facility] prior to 11/02/2022., [facility] visits prior to 11/02/2022.   Overall Financial Resource Strain (CARDIA)    Difficulty of Paying Living Expenses: Not very hard  Food Insecurity: No Food Insecurity (12/16/2019)   [facility] prior to 11/02/2022., [facility] visits prior to 11/02/2022.   Hunger Vital Sign    Worried About Running Out of Food in the Last Year: Never true    Ran Out of Food in the Last Year: Never true  Transportation Needs: No Transportation Needs (12/16/2019)   Received from [facility] visits prior to 11/02/2022., [facility] visits prior to 11/02/2022.   PRAPARE - Administrator, Civil Service (Medical): No    Lack of Transportation (Non-Medical): No  Physical Activity: Sufficiently Active (12/16/2019)   Received from [facility] visits prior to 11/02/2022., [facility] visits prior to 11/02/2022.   Exercise Vital Sign    Days of Exercise per Week: 7 days    Minutes of Exercise per Session: 120 min  Stress: No Stress Concern Present (12/16/2019)   [facility] prior to 11/02/2022., [facility] visits prior to 11/02/2022.   [employer] of Occupational Health - Occupational Stress Questionnaire    Feeling of Stress : Not at all  Social Connections: Socially Isolated (12/16/2019)   [facility] prior to 11/02/2022., [facility] visits prior to 11/02/2022.   Social Advertising account executive [NHANES]     Frequency of Communication with Friends and Family: Once a week    Frequency of Social Gatherings with Friends and Family: Once a week    Attends Religious Services: Never    Database administrator or Organizations: No    Attends Banker Meetings: Never    Marital Status: Never married  Intimate Partner Violence: Not At Risk (12/16/2019)   [facility] visits prior to 11/02/2022., [facility] visits prior to 11/02/2022.   Humiliation, Afraid, Rape, and Kick questionnaire    Fear of Current or Ex-Partner: No    Emotionally Abused: No    Physically Abused: No    Sexually Abused: No     Physical Exam:  Vital signs: BP 118/78   Pulse 65   Ht 6\' 1"  (1.854 m)   Wt 267 lb (121.1 kg)   BMI 35.23 kg/m   Constitutional: NAD, Well developed, Well nourished, alert and cooperative Head:  Normocephalic and atraumatic. Eyes:   PEERL, EOMI. No icterus. Conjunctiva pink. Respiratory: Respirations even and unlabored. Lungs clear to auscultation bilaterally.   No wheezes, crackles, or rhonchi.  Cardiovascular:  Regular rate and rhythm Gastrointestinal:  Soft, nondistended, nontender.  Msk:  Symmetrical without gross deformities. Without edema, no deformity or joint abnormality.  Neurologic:  Alert and  oriented x4;  grossly normal neurologically.  Skin:   Dry and intact without significant lesions or rashes. Psychiatric: Oriented to person, place and time. Demonstrates good judgement and reason without abnormal affect or behaviors.   RELEVANT LABS AND IMAGING: CBC    Component Value Date/Time   WBC 5.3 07/25/2023 1114   RBC 4.37 07/25/2023 1114   HGB 12.7 (L) 07/25/2023 1114   HCT 38.4 (L) 07/25/2023 1114   PLT 261.0 07/25/2023 1114   MCV 87.8 07/25/2023 1114   MCH 29.5 01/12/2021 1347   MCHC 33.0 07/25/2023 1114   RDW 14.1 07/25/2023 1114   LYMPHSABS 1.3 07/25/2023 1114   MONOABS 0.4 07/25/2023 1114   EOSABS 0.3  07/25/2023 1114   BASOSABS 0.0 07/25/2023 1114    CMP     Component Value Date/Time   NA 138 07/25/2023 1114   K 4.2 07/25/2023 1114   CL 102 07/25/2023 1114   CO2 28 07/25/2023 1114   GLUCOSE 80 07/25/2023 1114   BUN 26 (H) 07/25/2023 1114   CREATININE 1.60 (H) 07/25/2023 1114   CALCIUM 10.0 07/25/2023 1114   PROT 8.4 (H) 07/25/2023 1114   ALBUMIN 4.0 07/25/2023 1114   AST 11 07/25/2023 1114   ALT 10 07/25/2023 1114   ALKPHOS 85 07/25/2023 1114   BILITOT 0.4 07/25/2023 1114   EGD 08/26/23: - Normal esophagus. - Normal stomach. Biopsied. -  Duodenitis. Biopsied. Path:      1. Surgical [P], duodenal bx :       -  PEPTIC DUODENITIS       -  NO DYSPLASIA OR MALIGNANCY IDENTIFIED       2. Surgical [P], gastric bx :       -  BENIGN GASTRIC MUCOSA       -  NO H. PYLORI, INTESTINAL METAPLASIA OR MALIGNANCY IDENTIFIED   Colonoscopy 08/26/23: - The examined portion of the ileum was normal. - Non- bleeding internal hemorrhoids. - No specimens collected.  Assessment/Plan:   Hemorrhoids Constipation Anemia Patient overall is doing well after 2 sessions of hemorrhoidal banding.  He is no longer having any rectal bleeding.  His constipation is also under better control.  He uses MiraLAX as needed but has not had to do this very frequently because on average he has been having 1 bowel movement per day.  He does have anemia but his last set of labs showed that his iron levels were normal.  He may have a mild chronic anemia as result of his chronic kidney disease. - Continue Miralax PRN - RTC PRN  Eulah Pont, MD [facility] 11/28/2023, 9:12 AM  I spent 26 minutes of time, including independent review of results as outlined above, communicating results with the patient directly, face-to-face time with the patient, coordinating care, ordering studies and medications as appropriate, and documentation.

## 2024-06-18 ENCOUNTER — Ambulatory Visit
Admission: RE | Admit: 2024-06-18 | Discharge: 2024-06-18 | Disposition: A | Discharge status: Home / Self Care | Payer: Self-pay | Source: Ambulatory Visit | Attending: Family Medicine

## 2024-06-18 VITALS — BP 135/87 | HR 68 | Temp 98.6°F | Resp 17

## 2024-06-18 DIAGNOSIS — Z113 Encounter for screening for infections with a predominantly sexual mode of transmission: Secondary | ICD-10-CM | POA: Insufficient documentation

## 2024-06-18 NOTE — Discharge Instructions (Addendum)
 The clinic will contact you with the results of the testing done today if positive.  Follow-up as needed.

## 2024-06-18 NOTE — ED Triage Notes (Signed)
 Pt present for STD testing. Pt states he wants to update his status. Pt reports no symptoms. Pt is requesting blood work.

## 2024-06-18 NOTE — ED Provider Notes (Signed)
 [facility]    CSN: [id number] Arrival date & time: 06/18/24  0945      History   Chief Complaint Chief Complaint  Patient presents with   SEXUALLY TRANSMITTED DISEASE    Entered by patient    HPI Charles Gomez is a 33 y.o. male presents for STD screening.  He currently denies any symptoms including testicular pain or swelling, penile discharge, dysuria, fevers.  No known STD exposure.  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 Authorizing Provider  cholecalciferol (VITAMIN D3) 25 MCG (1000 UNIT) tablet Take 2,000 Units by mouth daily.    [provider]  ferrous sulfate 324 MG TBEC Take 324 mg by mouth.    [provider]  vitamin B-12 (CYANOCOBALAMIN) 1000 MCG tablet Take 1,000 mcg by mouth daily.    [provider]    Family History Family History  Problem Relation Age of Onset   Hypertension Mother    Migraines Mother    Healthy Father    Asthma Brother    Asthma Paternal Grandmother    Colon cancer Neg Hx    Esophageal cancer Neg Hx    Rectal cancer Neg Hx    Stomach cancer Neg Hx     Social History Social History   Tobacco Use   Smoking status: Never   Smokeless tobacco: Never  Vaping Use   Vaping status: Never Used  Substance Use Topics   Alcohol use: Not Currently   Drug use: Never     Allergies   Patient has no known  allergies.   Review of Systems Review of Systems  Genitourinary:        STD screening     Physical Exam Triage Vital Signs ED Triage Vitals  Encounter Vitals Group     BP 06/18/24 1003 135/87     Girls Systolic BP Percentile --      Girls Diastolic BP Percentile --      Boys Systolic BP Percentile --      Boys Diastolic BP Percentile --      Pulse Rate 06/18/24 1003 68     Resp 06/18/24 1003 17     Temp 06/18/24 1003 98.6 F (37 C)     Temp src --      SpO2 06/18/24 1003 98 %     Weight --      Height --      Head Circumference --      Peak Flow --      Pain Score 06/18/24 1002 0     Pain Loc --      Pain Education --      Exclude from Growth Chart --    No data found.  Updated Vital Signs BP 135/87 (BP Location: Right Arm)   Pulse 68   Temp  98.6 F (37 C)   Resp 17   SpO2 98%   Visual Acuity Right Eye Distance:   Left Eye Distance:   Bilateral Distance:    Right Eye Near:   Left Eye Near:    Bilateral Near:     Physical Exam Vitals and nursing note reviewed.  Constitutional:      Appearance: Normal appearance.  HENT:     Head: Normocephalic and atraumatic.  Eyes:     Pupils: Pupils are equal, round, and reactive to light.  Cardiovascular:     Rate and Rhythm: Normal rate.  Pulmonary:     Effort: Pulmonary effort is normal.  Skin:    General: Skin is warm and dry.  Neurological:     General: No focal deficit present.     Mental Status: He is alert and oriented to person, place, and time.  Psychiatric:        Mood and Affect: Mood normal.        Behavior: Behavior normal.      UC Treatments / Results  Labs (all labs ordered are listed, but only abnormal results are displayed) Labs Reviewed  RPR  HIV ANTIBODY (ROUTINE TESTING W REFLEX)  CYTOLOGY, (ORAL, ANAL, URETHRAL) ANCILLARY ONLY    EKG   Radiology No results found.  Procedures Procedures (including critical care time)  Medications Ordered in UC Medications - No data to  display  Initial Impression / Assessment and Plan / UC Course  I have reviewed the triage vital signs and the nursing notes.  Pertinent labs & imaging results that were available during my care of the patient were reviewed by me and considered in my medical decision making (see chart for details).     STD testing as ordered and will contact for any positive results.  Patient to follow-up as needed. Final Clinical Impressions(s) / UC Diagnoses   Final diagnoses:  Screening examination for STD (sexually transmitted disease)     Discharge Instructions      The clinic will contact you with the results of the testing done today if positive.  Follow-up as needed   ED Prescriptions   None    PDMP not reviewed this encounter.   Loreda Myla SAUNDERS, NP 06/18/24 1013

## 2024-06-19 LAB — RPR: RPR Ser Ql: NONREACTIVE

## 2024-06-19 LAB — HIV ANTIBODY (ROUTINE TESTING W REFLEX): HIV Screen 4th Generation wRfx: NONREACTIVE

## 2024-06-21 LAB — CYTOLOGY, (ORAL, ANAL, URETHRAL) ANCILLARY ONLY
Chlamydia: NEGATIVE
Comment: NEGATIVE
Comment: NEGATIVE
Comment: NORMAL
Neisseria Gonorrhea: NEGATIVE
Trichomonas: NEGATIVE
